# Patient Record
Sex: Female | Born: 1935 | Race: White | Hispanic: No | Marital: Single | Smoking: Current every day smoker
Health system: Southern US, Community
[De-identification: ages and names within clinical notes are randomized; demographics above are authoritative.]

## PROBLEM LIST (undated history)

## (undated) DIAGNOSIS — R911 Solitary pulmonary nodule: Secondary | ICD-10-CM

## (undated) DIAGNOSIS — E785 Hyperlipidemia, unspecified: Secondary | ICD-10-CM

## (undated) DIAGNOSIS — D751 Secondary polycythemia: Secondary | ICD-10-CM

## (undated) DIAGNOSIS — J449 Chronic obstructive pulmonary disease, unspecified: Secondary | ICD-10-CM

## (undated) DIAGNOSIS — E538 Deficiency of other specified B group vitamins: Secondary | ICD-10-CM

## (undated) DIAGNOSIS — C801 Malignant (primary) neoplasm, unspecified: Secondary | ICD-10-CM

## (undated) DIAGNOSIS — E559 Vitamin D deficiency, unspecified: Secondary | ICD-10-CM

## (undated) DIAGNOSIS — J189 Pneumonia, unspecified organism: Secondary | ICD-10-CM

## (undated) HISTORY — PX: EYE SURGERY: SHX253

## (undated) HISTORY — PX: COLON SURGERY: SHX602

## (undated) HISTORY — PX: CATARACT EXTRACTION: SUR2

## (undated) HISTORY — PX: TONSILLECTOMY: SUR1361

---

## 2004-12-31 ENCOUNTER — Ambulatory Visit: Payer: Self-pay | Admitting: Internal Medicine

## 2005-01-27 ENCOUNTER — Ambulatory Visit: Payer: Self-pay | Admitting: Gastroenterology

## 2006-12-20 ENCOUNTER — Ambulatory Visit: Payer: Self-pay | Admitting: Internal Medicine

## 2007-08-07 ENCOUNTER — Ambulatory Visit: Payer: Self-pay | Admitting: Ophthalmology

## 2008-01-09 ENCOUNTER — Ambulatory Visit: Payer: Self-pay | Admitting: Internal Medicine

## 2009-01-09 ENCOUNTER — Ambulatory Visit: Payer: Self-pay | Admitting: Internal Medicine

## 2010-01-13 ENCOUNTER — Ambulatory Visit: Payer: Self-pay | Admitting: Internal Medicine

## 2010-01-14 ENCOUNTER — Ambulatory Visit: Payer: Self-pay | Admitting: Internal Medicine

## 2010-01-15 ENCOUNTER — Ambulatory Visit: Payer: Self-pay | Admitting: Internal Medicine

## 2010-03-05 ENCOUNTER — Ambulatory Visit: Payer: Self-pay | Admitting: Gastroenterology

## 2010-06-23 ENCOUNTER — Ambulatory Visit: Payer: Self-pay | Admitting: Specialist

## 2010-06-30 ENCOUNTER — Ambulatory Visit: Payer: Self-pay | Admitting: Ophthalmology

## 2010-07-06 ENCOUNTER — Ambulatory Visit: Payer: Self-pay | Admitting: Ophthalmology

## 2010-11-30 ENCOUNTER — Ambulatory Visit: Payer: Self-pay | Admitting: Specialist

## 2011-01-19 ENCOUNTER — Ambulatory Visit: Payer: Self-pay | Admitting: Internal Medicine

## 2011-06-02 ENCOUNTER — Ambulatory Visit: Payer: Self-pay | Admitting: Specialist

## 2011-12-08 ENCOUNTER — Ambulatory Visit: Payer: Self-pay | Admitting: Specialist

## 2011-12-08 LAB — CREATININE, SERUM
Creatinine: 0.75 mg/dL (ref 0.60–1.30)
EGFR (African American): >60
EGFR (Non-African Amer.): >60

## 2012-01-13 ENCOUNTER — Ambulatory Visit: Payer: Self-pay | Admitting: Specialist

## 2012-02-04 ENCOUNTER — Ambulatory Visit: Payer: Self-pay | Admitting: Internal Medicine

## 2012-03-22 ENCOUNTER — Ambulatory Visit: Payer: Self-pay | Admitting: Specialist

## 2012-06-22 ENCOUNTER — Ambulatory Visit: Payer: Self-pay | Admitting: Specialist

## 2013-02-05 ENCOUNTER — Ambulatory Visit: Payer: Self-pay | Admitting: Internal Medicine

## 2013-06-25 ENCOUNTER — Ambulatory Visit: Payer: Self-pay | Admitting: Specialist

## 2014-01-15 ENCOUNTER — Ambulatory Visit: Payer: Self-pay | Admitting: Internal Medicine

## 2014-01-23 DIAGNOSIS — R918 Other nonspecific abnormal finding of lung field: Secondary | ICD-10-CM | POA: Insufficient documentation

## 2014-01-24 ENCOUNTER — Ambulatory Visit: Payer: Self-pay | Admitting: Internal Medicine

## 2014-02-06 ENCOUNTER — Ambulatory Visit: Payer: Self-pay | Admitting: Internal Medicine

## 2014-02-11 ENCOUNTER — Ambulatory Visit: Payer: Self-pay | Admitting: Internal Medicine

## 2014-02-11 LAB — PATHOLOGY REPORT

## 2014-02-12 DIAGNOSIS — Z720 Tobacco use: Secondary | ICD-10-CM | POA: Insufficient documentation

## 2014-02-12 DIAGNOSIS — F17200 Nicotine dependence, unspecified, uncomplicated: Secondary | ICD-10-CM | POA: Insufficient documentation

## 2014-02-13 ENCOUNTER — Ambulatory Visit: Payer: Self-pay | Admitting: Oncology

## 2014-02-13 LAB — CBC CANCER CENTER
Basophil #: 0.1 x10 3/mm (ref 0.0–0.1)
Basophil %: 1 %
Eosinophil #: 0.3 x10 3/mm (ref 0.0–0.7)
Eosinophil %: 3.1 %
HCT: 47 % (ref 35.0–47.0)
HGB: 15.7 g/dL (ref 12.0–16.0)
Lymphocyte #: 2 x10 3/mm (ref 1.0–3.6)
Lymphocyte %: 24.6 %
MCH: 30.9 pg (ref 26.0–34.0)
MCHC: 33.4 g/dL (ref 32.0–36.0)
MCV: 93 fL (ref 80–100)
MONO ABS: 0.5 x10 3/mm (ref 0.2–0.9)
Monocyte %: 6.3 %
Neutrophil #: 5.3 x10 3/mm (ref 1.4–6.5)
Neutrophil %: 65 %
Platelet: 241 x10 3/mm (ref 150–440)
RBC: 5.08 10*6/uL (ref 3.80–5.20)
RDW: 13.9 % (ref 11.5–14.5)
WBC: 8.2 x10 3/mm (ref 3.6–11.0)

## 2014-02-13 LAB — COMPREHENSIVE METABOLIC PANEL
ALBUMIN: 3.6 g/dL (ref 3.4–5.0)
ALT: 17 U/L (ref 12–78)
ANION GAP: 8 (ref 7–16)
Alkaline Phosphatase: 95 U/L
BILIRUBIN TOTAL: 0.3 mg/dL (ref 0.2–1.0)
BUN: 21 mg/dL — ABNORMAL HIGH (ref 7–18)
Calcium, Total: 9.9 mg/dL (ref 8.5–10.1)
Chloride: 103 mmol/L (ref 98–107)
Co2: 32 mmol/L (ref 21–32)
Creatinine: 0.85 mg/dL (ref 0.60–1.30)
EGFR (African American): >60
EGFR (Non-African Amer.): >60
Glucose: 97 mg/dL (ref 65–99)
Osmolality: 288 (ref 275–301)
Potassium: 3.8 mmol/L (ref 3.5–5.1)
SGOT(AST): 12 U/L — ABNORMAL LOW (ref 15–37)
SODIUM: 143 mmol/L (ref 136–145)
Total Protein: 7.9 g/dL (ref 6.4–8.2)

## 2014-02-19 ENCOUNTER — Ambulatory Visit: Payer: Self-pay | Admitting: Vascular Surgery

## 2014-02-22 LAB — BASIC METABOLIC PANEL
Anion Gap: 8 (ref 7–16)
BUN: 30 mg/dL — AB (ref 7–18)
CHLORIDE: 103 mmol/L (ref 98–107)
CO2: 26 mmol/L (ref 21–32)
CREATININE: 0.96 mg/dL (ref 0.60–1.30)
Calcium, Total: 8.7 mg/dL (ref 8.5–10.1)
EGFR (African American): >60
EGFR (Non-African Amer.): 57 — ABNORMAL LOW
GLUCOSE: 121 mg/dL — AB (ref 65–99)
OSMOLALITY: 281 (ref 275–301)
POTASSIUM: 4.4 mmol/L (ref 3.5–5.1)
SODIUM: 137 mmol/L (ref 136–145)

## 2014-02-22 LAB — MAGNESIUM: Magnesium: 1.7 mg/dL — ABNORMAL LOW

## 2014-02-27 ENCOUNTER — Ambulatory Visit: Payer: Self-pay | Admitting: Oncology

## 2014-02-27 LAB — CBC CANCER CENTER
Basophil #: 0 x10 3/mm (ref 0.0–0.1)
Basophil %: 0.5 %
Eosinophil #: 0.2 x10 3/mm (ref 0.0–0.7)
Eosinophil %: 2.6 %
HCT: 43.9 % (ref 35.0–47.0)
HGB: 14.8 g/dL (ref 12.0–16.0)
Lymphocyte #: 1.6 x10 3/mm (ref 1.0–3.6)
Lymphocyte %: 24.9 %
MCH: 31.2 pg (ref 26.0–34.0)
MCHC: 33.6 g/dL (ref 32.0–36.0)
MCV: 93 fL (ref 80–100)
Monocyte #: 0 x10 3/mm — ABNORMAL LOW (ref 0.2–0.9)
Monocyte %: 0.5 %
NEUTROS PCT: 71.5 %
Neutrophil #: 4.7 x10 3/mm (ref 1.4–6.5)
Platelet: 120 x10 3/mm — ABNORMAL LOW (ref 150–440)
RBC: 4.74 10*6/uL (ref 3.80–5.20)
RDW: 13.6 % (ref 11.5–14.5)
WBC: 6.6 x10 3/mm (ref 3.6–11.0)

## 2014-03-06 LAB — CBC CANCER CENTER
Basophil #: 0.1 x10 3/mm (ref 0.0–0.1)
Basophil %: 2.9 %
EOS ABS: 0 x10 3/mm (ref 0.0–0.7)
Eosinophil %: 1.4 %
HCT: 40.5 % (ref 35.0–47.0)
HGB: 13.7 g/dL (ref 12.0–16.0)
Lymphocyte #: 1.8 x10 3/mm (ref 1.0–3.6)
Lymphocyte %: 78.9 %
MCH: 31.4 pg (ref 26.0–34.0)
MCHC: 33.8 g/dL (ref 32.0–36.0)
MCV: 93 fL (ref 80–100)
MONOS PCT: 6.4 %
Monocyte #: 0.1 x10 3/mm — ABNORMAL LOW (ref 0.2–0.9)
NEUTROS ABS: 0.2 x10 3/mm — AB (ref 1.4–6.5)
NEUTROS PCT: 10.4 %
Platelet: 150 x10 3/mm (ref 150–440)
RBC: 4.37 10*6/uL (ref 3.80–5.20)
RDW: 13.2 % (ref 11.5–14.5)
WBC: 2.3 x10 3/mm — AB (ref 3.6–11.0)

## 2014-03-13 LAB — CBC CANCER CENTER
BASOS PCT: 1.3 %
Basophil #: 0.1 x10 3/mm (ref 0.0–0.1)
EOS ABS: 0.1 x10 3/mm (ref 0.0–0.7)
EOS PCT: 1 %
HCT: 42.4 % (ref 35.0–47.0)
HGB: 14.4 g/dL (ref 12.0–16.0)
LYMPHS PCT: 51.3 %
Lymphocyte #: 2.5 x10 3/mm (ref 1.0–3.6)
MCH: 31.3 pg (ref 26.0–34.0)
MCHC: 33.9 g/dL (ref 32.0–36.0)
MCV: 92 fL (ref 80–100)
Monocyte #: 0.7 x10 3/mm (ref 0.2–0.9)
Monocyte %: 13.6 %
NEUTROS ABS: 1.6 x10 3/mm (ref 1.4–6.5)
NEUTROS PCT: 32.8 %
Platelet: 436 x10 3/mm (ref 150–440)
RBC: 4.59 10*6/uL (ref 3.80–5.20)
RDW: 14.2 % (ref 11.5–14.5)
WBC: 4.9 x10 3/mm (ref 3.6–11.0)

## 2014-03-20 LAB — COMPREHENSIVE METABOLIC PANEL
ALBUMIN: 3.2 g/dL — AB (ref 3.4–5.0)
AST: 18 U/L (ref 15–37)
Alkaline Phosphatase: 87 U/L
Anion Gap: 7 (ref 7–16)
BILIRUBIN TOTAL: 0.3 mg/dL (ref 0.2–1.0)
BUN: 16 mg/dL (ref 7–18)
CALCIUM: 9.8 mg/dL (ref 8.5–10.1)
CREATININE: 0.76 mg/dL (ref 0.60–1.30)
Chloride: 102 mmol/L (ref 98–107)
Co2: 30 mmol/L (ref 21–32)
EGFR (African American): >60
EGFR (Non-African Amer.): >60
Glucose: 88 mg/dL (ref 65–99)
OSMOLALITY: 278 (ref 275–301)
POTASSIUM: 4 mmol/L (ref 3.5–5.1)
SGPT (ALT): 17 U/L
Sodium: 139 mmol/L (ref 136–145)
TOTAL PROTEIN: 7.1 g/dL (ref 6.4–8.2)

## 2014-03-20 LAB — CBC CANCER CENTER
BASOS PCT: 2.8 %
Basophil #: 0.2 x10 3/mm — ABNORMAL HIGH (ref 0.0–0.1)
Eosinophil #: 0 x10 3/mm (ref 0.0–0.7)
Eosinophil %: 0.6 %
HCT: 43.6 % (ref 35.0–47.0)
HGB: 14.5 g/dL (ref 12.0–16.0)
Lymphocyte #: 1.6 x10 3/mm (ref 1.0–3.6)
Lymphocyte %: 25.9 %
MCH: 30.8 pg (ref 26.0–34.0)
MCHC: 33.2 g/dL (ref 32.0–36.0)
MCV: 93 fL (ref 80–100)
MONO ABS: 0.7 x10 3/mm (ref 0.2–0.9)
Monocyte %: 11.6 %
Neutrophil #: 3.6 x10 3/mm (ref 1.4–6.5)
Neutrophil %: 59.1 %
PLATELETS: 331 x10 3/mm (ref 150–440)
RBC: 4.7 10*6/uL (ref 3.80–5.20)
RDW: 14.4 % (ref 11.5–14.5)
WBC: 6.1 x10 3/mm (ref 3.6–11.0)

## 2014-03-27 LAB — CBC CANCER CENTER
BASOS ABS: 0.1 x10 3/mm (ref 0.0–0.1)
Basophil %: 1.1 %
EOS ABS: 0.1 x10 3/mm (ref 0.0–0.7)
Eosinophil %: 2.1 %
HCT: 40.7 % (ref 35.0–47.0)
HGB: 13.8 g/dL (ref 12.0–16.0)
LYMPHS PCT: 12.2 %
Lymphocyte #: 0.7 x10 3/mm — ABNORMAL LOW (ref 1.0–3.6)
MCH: 31.7 pg (ref 26.0–34.0)
MCHC: 34 g/dL (ref 32.0–36.0)
MCV: 93 fL (ref 80–100)
MONO ABS: 0 x10 3/mm — AB (ref 0.2–0.9)
Monocyte %: 0.9 %
Neutrophil #: 4.8 x10 3/mm (ref 1.4–6.5)
Neutrophil %: 83.7 %
Platelet: 150 x10 3/mm (ref 150–440)
RBC: 4.36 10*6/uL (ref 3.80–5.20)
RDW: 14.1 % (ref 11.5–14.5)
WBC: 5.7 x10 3/mm (ref 3.6–11.0)

## 2014-03-30 ENCOUNTER — Ambulatory Visit: Payer: Self-pay | Admitting: Oncology

## 2014-04-10 LAB — COMPREHENSIVE METABOLIC PANEL
ANION GAP: 8 (ref 7–16)
Albumin: 3 g/dL — ABNORMAL LOW (ref 3.4–5.0)
Alkaline Phosphatase: 79 U/L
BILIRUBIN TOTAL: 0.3 mg/dL (ref 0.2–1.0)
BUN: 13 mg/dL (ref 7–18)
CREATININE: 0.96 mg/dL (ref 0.60–1.30)
Calcium, Total: 9 mg/dL (ref 8.5–10.1)
Chloride: 103 mmol/L (ref 98–107)
Co2: 29 mmol/L (ref 21–32)
EGFR (African American): >60
GFR CALC NON AF AMER: 57 — AB
GLUCOSE: 141 mg/dL — AB (ref 65–99)
Osmolality: 282 (ref 275–301)
Potassium: 3.3 mmol/L — ABNORMAL LOW (ref 3.5–5.1)
SGOT(AST): 13 U/L — ABNORMAL LOW (ref 15–37)
SGPT (ALT): 17 U/L
SODIUM: 140 mmol/L (ref 136–145)
Total Protein: 6.7 g/dL (ref 6.4–8.2)

## 2014-04-10 LAB — CBC CANCER CENTER
BASOS ABS: 0.1 x10 3/mm (ref 0.0–0.1)
Basophil %: 2.5 %
EOS ABS: 0.2 x10 3/mm (ref 0.0–0.7)
EOS PCT: 6.5 %
HCT: 38.9 % (ref 35.0–47.0)
HGB: 13 g/dL (ref 12.0–16.0)
Lymphocyte #: 0.6 x10 3/mm — ABNORMAL LOW (ref 1.0–3.6)
Lymphocyte %: 25.3 %
MCH: 31.4 pg (ref 26.0–34.0)
MCHC: 33.5 g/dL (ref 32.0–36.0)
MCV: 94 fL (ref 80–100)
Monocyte #: 0.7 x10 3/mm (ref 0.2–0.9)
Monocyte %: 28.5 %
Neutrophil #: 0.9 x10 3/mm — ABNORMAL LOW (ref 1.4–6.5)
Neutrophil %: 37.2 %
Platelet: 296 x10 3/mm (ref 150–440)
RBC: 4.15 10*6/uL (ref 3.80–5.20)
RDW: 14.2 % (ref 11.5–14.5)
WBC: 2.4 x10 3/mm — ABNORMAL LOW (ref 3.6–11.0)

## 2014-04-17 LAB — CREATININE, SERUM
Creatinine: 0.83 mg/dL (ref 0.60–1.30)
EGFR (African American): >60
EGFR (Non-African Amer.): >60

## 2014-04-17 LAB — CBC CANCER CENTER
Basophil #: 0.1 x10 3/mm (ref 0.0–0.1)
Basophil %: 2.7 %
EOS ABS: 0.1 x10 3/mm (ref 0.0–0.7)
Eosinophil %: 1.4 %
HCT: 39.6 % (ref 35.0–47.0)
HGB: 13.3 g/dL (ref 12.0–16.0)
LYMPHS ABS: 0.8 x10 3/mm — AB (ref 1.0–3.6)
Lymphocyte %: 15.9 %
MCH: 31.5 pg (ref 26.0–34.0)
MCHC: 33.5 g/dL (ref 32.0–36.0)
MCV: 94 fL (ref 80–100)
MONO ABS: 0.6 x10 3/mm (ref 0.2–0.9)
Monocyte %: 13.5 %
NEUTROS PCT: 66.5 %
Neutrophil #: 3.2 x10 3/mm (ref 1.4–6.5)
Platelet: 237 x10 3/mm (ref 150–440)
RBC: 4.21 10*6/uL (ref 3.80–5.20)
RDW: 15.8 % — AB (ref 11.5–14.5)
WBC: 4.8 x10 3/mm (ref 3.6–11.0)

## 2014-04-30 ENCOUNTER — Ambulatory Visit: Payer: Self-pay | Admitting: Oncology

## 2014-05-15 LAB — COMPREHENSIVE METABOLIC PANEL
Albumin: 3.1 g/dL — ABNORMAL LOW (ref 3.4–5.0)
Alkaline Phosphatase: 79 U/L
Anion Gap: 9 (ref 7–16)
BUN: 18 mg/dL (ref 7–18)
Bilirubin,Total: 0.2 mg/dL (ref 0.2–1.0)
CHLORIDE: 100 mmol/L (ref 98–107)
CO2: 26 mmol/L (ref 21–32)
CREATININE: 1.07 mg/dL (ref 0.60–1.30)
Calcium, Total: 8.8 mg/dL (ref 8.5–10.1)
EGFR (Non-African Amer.): 50 — ABNORMAL LOW
GFR CALC AF AMER: 58 — AB
GLUCOSE: 125 mg/dL — AB (ref 65–99)
OSMOLALITY: 273 (ref 275–301)
Potassium: 3.6 mmol/L (ref 3.5–5.1)
SGOT(AST): 18 U/L (ref 15–37)
SGPT (ALT): 16 U/L
Sodium: 135 mmol/L — ABNORMAL LOW (ref 136–145)
TOTAL PROTEIN: 6.8 g/dL (ref 6.4–8.2)

## 2014-05-15 LAB — CBC CANCER CENTER
Basophil #: 0.1 x10 3/mm (ref 0.0–0.1)
Basophil %: 2.3 %
Eosinophil #: 0 x10 3/mm (ref 0.0–0.7)
Eosinophil %: 0.7 %
HCT: 34.2 % — ABNORMAL LOW (ref 35.0–47.0)
HGB: 11.5 g/dL — ABNORMAL LOW (ref 12.0–16.0)
LYMPHS ABS: 0.5 x10 3/mm — AB (ref 1.0–3.6)
LYMPHS PCT: 11.8 %
MCH: 32.3 pg (ref 26.0–34.0)
MCHC: 33.7 g/dL (ref 32.0–36.0)
MCV: 96 fL (ref 80–100)
MONO ABS: 0.6 x10 3/mm (ref 0.2–0.9)
MONOS PCT: 12.4 %
Neutrophil #: 3.3 x10 3/mm (ref 1.4–6.5)
Neutrophil %: 72.8 %
Platelet: 233 x10 3/mm (ref 150–440)
RBC: 3.57 10*6/uL — ABNORMAL LOW (ref 3.80–5.20)
RDW: 16.5 % — AB (ref 11.5–14.5)
WBC: 4.5 x10 3/mm (ref 3.6–11.0)

## 2014-05-21 ENCOUNTER — Inpatient Hospital Stay: Payer: Self-pay | Admitting: Internal Medicine

## 2014-05-21 LAB — COMPREHENSIVE METABOLIC PANEL
Albumin: 2.9 g/dL — ABNORMAL LOW (ref 3.4–5.0)
Alkaline Phosphatase: 72 U/L
Anion Gap: 7 (ref 7–16)
BUN: 29 mg/dL — AB (ref 7–18)
Bilirubin,Total: 0.5 mg/dL (ref 0.2–1.0)
Calcium, Total: 7.9 mg/dL — ABNORMAL LOW (ref 8.5–10.1)
Chloride: 103 mmol/L (ref 98–107)
Co2: 27 mmol/L (ref 21–32)
Creatinine: 1.14 mg/dL (ref 0.60–1.30)
GFR CALC AF AMER: 53 — AB
GFR CALC NON AF AMER: 46 — AB
Glucose: 115 mg/dL — ABNORMAL HIGH (ref 65–99)
OSMOLALITY: 281 (ref 275–301)
POTASSIUM: 3.7 mmol/L (ref 3.5–5.1)
SGOT(AST): 26 U/L (ref 15–37)
SGPT (ALT): 23 U/L
Sodium: 137 mmol/L (ref 136–145)
TOTAL PROTEIN: 6.2 g/dL — AB (ref 6.4–8.2)

## 2014-05-21 LAB — URINALYSIS, COMPLETE
BILIRUBIN, UR: NEGATIVE
Glucose,UR: NEGATIVE mg/dL (ref 0–75)
KETONE: NEGATIVE
Nitrite: NEGATIVE
PH: 5 (ref 4.5–8.0)
Protein: 75
Specific Gravity: 1.005 (ref 1.003–1.030)
Squamous Epithelial: <1
WBC UR: 153 /HPF (ref 0–5)

## 2014-05-21 LAB — CBC
HCT: 30.8 % — ABNORMAL LOW (ref 35.0–47.0)
HGB: 10 g/dL — AB (ref 12.0–16.0)
MCH: 31.7 pg (ref 26.0–34.0)
MCHC: 32.5 g/dL (ref 32.0–36.0)
MCV: 97 fL (ref 80–100)
Platelet: 123 10*3/uL — ABNORMAL LOW (ref 150–440)
RBC: 3.16 10*6/uL — AB (ref 3.80–5.20)
RDW: 16.7 % — ABNORMAL HIGH (ref 11.5–14.5)
WBC: 5 10*3/uL (ref 3.6–11.0)

## 2014-05-21 LAB — LIPASE, BLOOD: Lipase: 59 U/L — ABNORMAL LOW (ref 73–393)

## 2014-05-21 LAB — TROPONIN I

## 2014-05-22 LAB — CBC WITH DIFFERENTIAL/PLATELET
Basophil #: 0 10*3/uL (ref 0.0–0.1)
Basophil %: 0.2 %
EOS PCT: 0.1 %
Eosinophil #: 0 10*3/uL (ref 0.0–0.7)
HCT: 28.1 % — ABNORMAL LOW (ref 35.0–47.0)
HGB: 9.4 g/dL — AB (ref 12.0–16.0)
LYMPHS PCT: 2.4 %
Lymphocyte #: 0.1 10*3/uL — ABNORMAL LOW (ref 1.0–3.6)
MCH: 32.4 pg (ref 26.0–34.0)
MCHC: 33.7 g/dL (ref 32.0–36.0)
MCV: 96 fL (ref 80–100)
MONOS PCT: 0.3 %
Monocyte #: 0 x10 3/mm — ABNORMAL LOW (ref 0.2–0.9)
NEUTROS ABS: 3.3 10*3/uL (ref 1.4–6.5)
Neutrophil %: 97 %
PLATELETS: 94 10*3/uL — AB (ref 150–440)
RBC: 2.92 10*6/uL — ABNORMAL LOW (ref 3.80–5.20)
RDW: 16.7 % — ABNORMAL HIGH (ref 11.5–14.5)
WBC: 3.4 10*3/uL — ABNORMAL LOW (ref 3.6–11.0)

## 2014-05-22 LAB — BASIC METABOLIC PANEL
ANION GAP: 5 — AB (ref 7–16)
BUN: 27 mg/dL — ABNORMAL HIGH (ref 7–18)
CHLORIDE: 108 mmol/L — AB (ref 98–107)
Calcium, Total: 7.2 mg/dL — ABNORMAL LOW (ref 8.5–10.1)
Co2: 25 mmol/L (ref 21–32)
Creatinine: 1.11 mg/dL (ref 0.60–1.30)
EGFR (African American): 55 — ABNORMAL LOW
GFR CALC NON AF AMER: 48 — AB
Glucose: 137 mg/dL — ABNORMAL HIGH (ref 65–99)
Osmolality: 283 (ref 275–301)
Potassium: 4 mmol/L (ref 3.5–5.1)
Sodium: 138 mmol/L (ref 136–145)

## 2014-05-23 LAB — CBC WITH DIFFERENTIAL/PLATELET
Basophil #: 0 10*3/uL (ref 0.0–0.1)
Basophil %: 0.1 %
Eosinophil #: 0 10*3/uL (ref 0.0–0.7)
Eosinophil %: 0 %
HCT: 25.6 % — ABNORMAL LOW (ref 35.0–47.0)
HGB: 8.6 g/dL — AB (ref 12.0–16.0)
LYMPHS ABS: 0.1 10*3/uL — AB (ref 1.0–3.6)
Lymphocyte %: 1.7 %
MCH: 32.6 pg (ref 26.0–34.0)
MCHC: 33.8 g/dL (ref 32.0–36.0)
MCV: 96 fL (ref 80–100)
Monocyte #: 0 x10 3/mm — ABNORMAL LOW (ref 0.2–0.9)
Monocyte %: 0.1 %
NEUTROS PCT: 98.1 %
Neutrophil #: 5.3 10*3/uL (ref 1.4–6.5)
PLATELETS: 74 10*3/uL — AB (ref 150–440)
RBC: 2.66 10*6/uL — ABNORMAL LOW (ref 3.80–5.20)
RDW: 16.8 % — AB (ref 11.5–14.5)
WBC: 5.4 10*3/uL (ref 3.6–11.0)

## 2014-05-23 LAB — URINE CULTURE

## 2014-05-23 LAB — COMPREHENSIVE METABOLIC PANEL
ALBUMIN: 2.6 g/dL — AB (ref 3.4–5.0)
ALK PHOS: 58 U/L
Anion Gap: 7 (ref 7–16)
BUN: 26 mg/dL — AB (ref 7–18)
Bilirubin,Total: 0.2 mg/dL (ref 0.2–1.0)
CREATININE: 1.03 mg/dL (ref 0.60–1.30)
Calcium, Total: 7.1 mg/dL — ABNORMAL LOW (ref 8.5–10.1)
Chloride: 109 mmol/L — ABNORMAL HIGH (ref 98–107)
Co2: 25 mmol/L (ref 21–32)
EGFR (African American): >60
GFR CALC NON AF AMER: 55 — AB
Glucose: 139 mg/dL — ABNORMAL HIGH (ref 65–99)
OSMOLALITY: 288 (ref 275–301)
Potassium: 3.7 mmol/L (ref 3.5–5.1)
SGOT(AST): 24 U/L (ref 15–37)
SGPT (ALT): 19 U/L
Sodium: 141 mmol/L (ref 136–145)
TOTAL PROTEIN: 5.6 g/dL — AB (ref 6.4–8.2)

## 2014-05-24 ENCOUNTER — Inpatient Hospital Stay: Payer: Self-pay | Admitting: Internal Medicine

## 2014-05-24 LAB — CBC WITH DIFFERENTIAL/PLATELET
BASOS PCT: 0 %
Basophil #: 0 10*3/uL (ref 0.0–0.1)
EOS ABS: 0 10*3/uL (ref 0.0–0.7)
EOS PCT: 0 %
HCT: 25.4 % — ABNORMAL LOW (ref 35.0–47.0)
HGB: 8.5 g/dL — ABNORMAL LOW (ref 12.0–16.0)
LYMPHS ABS: 0.1 10*3/uL — AB (ref 1.0–3.6)
LYMPHS PCT: 3.4 %
MCH: 32.3 pg (ref 26.0–34.0)
MCHC: 33.6 g/dL (ref 32.0–36.0)
MCV: 96 fL (ref 80–100)
Monocyte #: 0 x10 3/mm — ABNORMAL LOW (ref 0.2–0.9)
Monocyte %: 0.8 %
NEUTROS PCT: 95.8 %
Neutrophil #: 3.6 10*3/uL (ref 1.4–6.5)
Platelet: 54 10*3/uL — ABNORMAL LOW (ref 150–440)
RBC: 2.65 10*6/uL — ABNORMAL LOW (ref 3.80–5.20)
RDW: 16.7 % — AB (ref 11.5–14.5)
WBC: 3.8 10*3/uL (ref 3.6–11.0)

## 2014-05-24 LAB — BASIC METABOLIC PANEL
Anion Gap: 8 (ref 7–16)
BUN: 24 mg/dL — AB (ref 7–18)
CREATININE: 0.92 mg/dL (ref 0.60–1.30)
Calcium, Total: 6.8 mg/dL — CL (ref 8.5–10.1)
Chloride: 107 mmol/L (ref 98–107)
Co2: 27 mmol/L (ref 21–32)
EGFR (Non-African Amer.): >60
Glucose: 107 mg/dL — ABNORMAL HIGH (ref 65–99)
OSMOLALITY: 288 (ref 275–301)
Potassium: 3.1 mmol/L — ABNORMAL LOW (ref 3.5–5.1)
Sodium: 142 mmol/L (ref 136–145)

## 2014-05-24 LAB — CBC
HCT: 27.1 % — ABNORMAL LOW (ref 35.0–47.0)
HGB: 9.1 g/dL — AB (ref 12.0–16.0)
MCH: 32.3 pg (ref 26.0–34.0)
MCHC: 33.5 g/dL (ref 32.0–36.0)
MCV: 96 fL (ref 80–100)
Platelet: 51 10*3/uL — ABNORMAL LOW (ref 150–440)
RBC: 2.82 10*6/uL — AB (ref 3.80–5.20)
RDW: 16.8 % — ABNORMAL HIGH (ref 11.5–14.5)
WBC: 2.2 10*3/uL — AB (ref 3.6–11.0)

## 2014-05-24 LAB — COMPREHENSIVE METABOLIC PANEL
ALBUMIN: 3.1 g/dL — AB (ref 3.4–5.0)
ANION GAP: 8 (ref 7–16)
Alkaline Phosphatase: 60 U/L
BUN: 28 mg/dL — ABNORMAL HIGH (ref 7–18)
Bilirubin,Total: 0.4 mg/dL (ref 0.2–1.0)
CREATININE: 1.22 mg/dL (ref 0.60–1.30)
Calcium, Total: 6.7 mg/dL — CL (ref 8.5–10.1)
Chloride: 107 mmol/L (ref 98–107)
Co2: 25 mmol/L (ref 21–32)
EGFR (African American): 55 — ABNORMAL LOW
EGFR (Non-African Amer.): 45 — ABNORMAL LOW
Glucose: 142 mg/dL — ABNORMAL HIGH (ref 65–99)
OSMOLALITY: 287 (ref 275–301)
Potassium: 3.5 mmol/L (ref 3.5–5.1)
SGOT(AST): 24 U/L (ref 15–37)
SGPT (ALT): 30 U/L
SODIUM: 140 mmol/L (ref 136–145)
Total Protein: 6.3 g/dL — ABNORMAL LOW (ref 6.4–8.2)

## 2014-05-24 LAB — MAGNESIUM: Magnesium: <0.3 mg/dL

## 2014-05-24 LAB — PHOSPHORUS: PHOSPHORUS: 2.6 mg/dL (ref 2.5–4.9)

## 2014-05-25 LAB — CBC WITH DIFFERENTIAL/PLATELET
Basophil #: 0 10*3/uL (ref 0.0–0.1)
Basophil %: 0.2 %
EOS ABS: 0 10*3/uL (ref 0.0–0.7)
Eosinophil %: 0.2 %
HCT: 28.5 % — ABNORMAL LOW (ref 35.0–47.0)
HGB: 9.4 g/dL — ABNORMAL LOW (ref 12.0–16.0)
Lymphocyte #: 0.3 10*3/uL — ABNORMAL LOW (ref 1.0–3.6)
Lymphocyte %: 13.2 %
MCH: 32 pg (ref 26.0–34.0)
MCHC: 32.9 g/dL (ref 32.0–36.0)
MCV: 97 fL (ref 80–100)
MONO ABS: 0 x10 3/mm — AB (ref 0.2–0.9)
MONOS PCT: 2.1 %
NEUTROS ABS: 1.9 10*3/uL (ref 1.4–6.5)
Neutrophil %: 84.3 %
Platelet: 49 10*3/uL — ABNORMAL LOW (ref 150–440)
RBC: 2.93 10*6/uL — ABNORMAL LOW (ref 3.80–5.20)
RDW: 17.1 % — ABNORMAL HIGH (ref 11.5–14.5)
WBC: 2.2 10*3/uL — AB (ref 3.6–11.0)

## 2014-05-25 LAB — BASIC METABOLIC PANEL
ANION GAP: 7 (ref 7–16)
BUN: 22 mg/dL — ABNORMAL HIGH (ref 7–18)
Calcium, Total: 7.9 mg/dL — ABNORMAL LOW (ref 8.5–10.1)
Chloride: 105 mmol/L (ref 98–107)
Co2: 29 mmol/L (ref 21–32)
Creatinine: 1.03 mg/dL (ref 0.60–1.30)
EGFR (Non-African Amer.): 55 — ABNORMAL LOW
Glucose: 80 mg/dL (ref 65–99)
Osmolality: 284 (ref 275–301)
Potassium: 3.4 mmol/L — ABNORMAL LOW (ref 3.5–5.1)
Sodium: 141 mmol/L (ref 136–145)

## 2014-05-25 LAB — MAGNESIUM: MAGNESIUM: 1.6 mg/dL — AB

## 2014-05-26 LAB — BASIC METABOLIC PANEL
Anion Gap: 5 — ABNORMAL LOW (ref 7–16)
BUN: 31 mg/dL — ABNORMAL HIGH (ref 7–18)
CALCIUM: 8 mg/dL — AB (ref 8.5–10.1)
CHLORIDE: 104 mmol/L (ref 98–107)
CO2: 31 mmol/L (ref 21–32)
CREATININE: 0.89 mg/dL (ref 0.60–1.30)
EGFR (African American): >60
EGFR (Non-African Amer.): >60
Glucose: 65 mg/dL (ref 65–99)
OSMOLALITY: 284 (ref 275–301)
Potassium: 3.1 mmol/L — ABNORMAL LOW (ref 3.5–5.1)
Sodium: 140 mmol/L (ref 136–145)

## 2014-05-26 LAB — CBC WITH DIFFERENTIAL/PLATELET
BASOS ABS: 0 10*3/uL (ref 0.0–0.1)
BASOS PCT: 0.2 %
Eosinophil #: 0 10*3/uL (ref 0.0–0.7)
Eosinophil %: 0.6 %
HCT: 28 % — AB (ref 35.0–47.0)
HGB: 9.3 g/dL — AB (ref 12.0–16.0)
LYMPHS ABS: 0.3 10*3/uL — AB (ref 1.0–3.6)
Lymphocyte %: 20.3 %
MCH: 32.1 pg (ref 26.0–34.0)
MCHC: 33.2 g/dL (ref 32.0–36.0)
MCV: 97 fL (ref 80–100)
Monocyte #: 0.1 x10 3/mm — ABNORMAL LOW (ref 0.2–0.9)
Monocyte %: 4.5 %
Neutrophil #: 1 10*3/uL — ABNORMAL LOW (ref 1.4–6.5)
Neutrophil %: 74.4 %
PLATELETS: 36 10*3/uL — AB (ref 150–440)
RBC: 2.9 10*6/uL — AB (ref 3.80–5.20)
RDW: 16.6 % — ABNORMAL HIGH (ref 11.5–14.5)
WBC: 1.3 10*3/uL — CL (ref 3.6–11.0)

## 2014-05-26 LAB — CULTURE, BLOOD (SINGLE)

## 2014-05-26 LAB — MAGNESIUM: MAGNESIUM: 0.9 mg/dL — AB

## 2014-05-27 LAB — CBC WITH DIFFERENTIAL/PLATELET
BASOS ABS: 0 10*3/uL (ref 0.0–0.1)
BASOS PCT: 0.1 %
EOS ABS: 0 10*3/uL (ref 0.0–0.7)
Eosinophil %: 0.7 %
HCT: 29.2 % — ABNORMAL LOW (ref 35.0–47.0)
HGB: 9.8 g/dL — ABNORMAL LOW (ref 12.0–16.0)
LYMPHS PCT: 25.7 %
Lymphocyte #: 0.3 10*3/uL — ABNORMAL LOW (ref 1.0–3.6)
MCH: 32.5 pg (ref 26.0–34.0)
MCHC: 33.5 g/dL (ref 32.0–36.0)
MCV: 97 fL (ref 80–100)
Monocyte #: 0.1 x10 3/mm — ABNORMAL LOW (ref 0.2–0.9)
Monocyte %: 7.4 %
Neutrophil #: 0.7 10*3/uL — ABNORMAL LOW (ref 1.4–6.5)
Neutrophil %: 66.1 %
PLATELETS: 32 10*3/uL — AB (ref 150–440)
RBC: 3.01 10*6/uL — AB (ref 3.80–5.20)
RDW: 16.9 % — ABNORMAL HIGH (ref 11.5–14.5)
WBC: 1.1 10*3/uL — AB (ref 3.6–11.0)

## 2014-05-27 LAB — BASIC METABOLIC PANEL
Anion Gap: 6 — ABNORMAL LOW (ref 7–16)
BUN: 43 mg/dL — ABNORMAL HIGH (ref 7–18)
CHLORIDE: 105 mmol/L (ref 98–107)
CO2: 29 mmol/L (ref 21–32)
Calcium, Total: 8.5 mg/dL (ref 8.5–10.1)
Creatinine: 0.98 mg/dL (ref 0.60–1.30)
GFR CALC NON AF AMER: 58 — AB
GLUCOSE: 83 mg/dL (ref 65–99)
Osmolality: 289 (ref 275–301)
POTASSIUM: 4.6 mmol/L (ref 3.5–5.1)
Sodium: 140 mmol/L (ref 136–145)

## 2014-05-27 LAB — MAGNESIUM: Magnesium: 1.6 mg/dL — ABNORMAL LOW

## 2014-05-27 LAB — OCCULT BLOOD X 1 CARD TO LAB, STOOL: Occult Blood, Feces: NEGATIVE

## 2014-05-28 LAB — CBC WITH DIFFERENTIAL/PLATELET
BASOS PCT: 0.3 %
Basophil #: 0 10*3/uL (ref 0.0–0.1)
EOS PCT: 1 %
Eosinophil #: 0 10*3/uL (ref 0.0–0.7)
HCT: 29 % — ABNORMAL LOW (ref 35.0–47.0)
HGB: 9.7 g/dL — ABNORMAL LOW (ref 12.0–16.0)
LYMPHS PCT: 27.6 %
Lymphocyte #: 0.2 10*3/uL — ABNORMAL LOW (ref 1.0–3.6)
MCH: 32.5 pg (ref 26.0–34.0)
MCHC: 33.4 g/dL (ref 32.0–36.0)
MCV: 97 fL (ref 80–100)
MONO ABS: 0.1 x10 3/mm — AB (ref 0.2–0.9)
MONOS PCT: 19.7 %
Neutrophil #: 0.3 10*3/uL — ABNORMAL LOW (ref 1.4–6.5)
Neutrophil %: 51.4 %
RBC: 2.98 10*6/uL — ABNORMAL LOW (ref 3.80–5.20)
RDW: 16.7 % — AB (ref 11.5–14.5)
WBC: 0.7 10*3/uL — CL (ref 3.6–11.0)

## 2014-05-28 LAB — BASIC METABOLIC PANEL
Anion Gap: 4 — ABNORMAL LOW (ref 7–16)
BUN: 47 mg/dL — ABNORMAL HIGH (ref 7–18)
CALCIUM: 9 mg/dL (ref 8.5–10.1)
CO2: 27 mmol/L (ref 21–32)
CREATININE: 1.08 mg/dL (ref 0.60–1.30)
Chloride: 105 mmol/L (ref 98–107)
EGFR (African American): >60
EGFR (Non-African Amer.): 52 — ABNORMAL LOW
Glucose: 87 mg/dL (ref 65–99)
Osmolality: 284 (ref 275–301)
Potassium: 5.3 mmol/L — ABNORMAL HIGH (ref 3.5–5.1)
Sodium: 136 mmol/L (ref 136–145)

## 2014-05-28 LAB — MAGNESIUM: MAGNESIUM: 1.7 mg/dL — AB

## 2014-05-29 LAB — BASIC METABOLIC PANEL
Anion Gap: 6 — ABNORMAL LOW (ref 7–16)
BUN: 41 mg/dL — ABNORMAL HIGH (ref 7–18)
Calcium, Total: 8.9 mg/dL (ref 8.5–10.1)
Chloride: 103 mmol/L (ref 98–107)
Co2: 27 mmol/L (ref 21–32)
Creatinine: 1.08 mg/dL (ref 0.60–1.30)
EGFR (Non-African Amer.): 52 — ABNORMAL LOW
Glucose: 107 mg/dL — ABNORMAL HIGH (ref 65–99)
OSMOLALITY: 283 (ref 275–301)
Potassium: 4.8 mmol/L (ref 3.5–5.1)
SODIUM: 136 mmol/L (ref 136–145)

## 2014-05-29 LAB — CBC WITH DIFFERENTIAL/PLATELET
BASOS ABS: 0 10*3/uL (ref 0.0–0.1)
BASOS PCT: 0.2 %
EOS ABS: 0 10*3/uL (ref 0.0–0.7)
Eosinophil %: 1.9 %
HCT: 27.5 % — AB (ref 35.0–47.0)
HGB: 9.4 g/dL — AB (ref 12.0–16.0)
LYMPHS ABS: 0.3 10*3/uL — AB (ref 1.0–3.6)
Lymphocyte %: 38.3 %
MCH: 33.2 pg (ref 26.0–34.0)
MCHC: 34.3 g/dL (ref 32.0–36.0)
MCV: 97 fL (ref 80–100)
Monocyte #: 0.3 x10 3/mm (ref 0.2–0.9)
Monocyte %: 34.3 %
NEUTROS ABS: 0.2 10*3/uL — AB (ref 1.4–6.5)
NEUTROS PCT: 25.3 %
Platelet: 41 10*3/uL — ABNORMAL LOW (ref 150–440)
RBC: 2.84 10*6/uL — AB (ref 3.80–5.20)
RDW: 16.5 % — ABNORMAL HIGH (ref 11.5–14.5)
WBC: 0.8 10*3/uL — AB (ref 3.6–11.0)

## 2014-05-29 LAB — MAGNESIUM: Magnesium: 1.9 mg/dL

## 2014-05-30 ENCOUNTER — Ambulatory Visit: Payer: Self-pay | Admitting: Oncology

## 2014-06-05 LAB — MAGNESIUM: MAGNESIUM: 0.7 mg/dL — AB

## 2014-06-05 LAB — COMPREHENSIVE METABOLIC PANEL
ALK PHOS: 81 U/L
AST: 23 U/L (ref 15–37)
Albumin: 3.3 g/dL — ABNORMAL LOW (ref 3.4–5.0)
Anion Gap: 9 (ref 7–16)
BUN: 41 mg/dL — ABNORMAL HIGH (ref 7–18)
Bilirubin,Total: 0.2 mg/dL (ref 0.2–1.0)
CALCIUM: 10 mg/dL (ref 8.5–10.1)
CO2: 27 mmol/L (ref 21–32)
Chloride: 100 mmol/L (ref 98–107)
Creatinine: 1.17 mg/dL (ref 0.60–1.30)
EGFR (African American): 58 — ABNORMAL LOW
EGFR (Non-African Amer.): 48 — ABNORMAL LOW
Glucose: 152 mg/dL — ABNORMAL HIGH (ref 65–99)
Osmolality: 285 (ref 275–301)
POTASSIUM: 4.3 mmol/L (ref 3.5–5.1)
SGPT (ALT): 33 U/L
SODIUM: 136 mmol/L (ref 136–145)
TOTAL PROTEIN: 6.4 g/dL (ref 6.4–8.2)

## 2014-06-05 LAB — CBC CANCER CENTER
Basophil #: 0 x10 3/mm (ref 0.0–0.1)
Basophil %: 0.3 %
EOS PCT: 0.1 %
Eosinophil #: 0 x10 3/mm (ref 0.0–0.7)
HCT: 30.7 % — AB (ref 35.0–47.0)
HGB: 10.3 g/dL — ABNORMAL LOW (ref 12.0–16.0)
LYMPHS ABS: 0.4 x10 3/mm — AB (ref 1.0–3.6)
Lymphocyte %: 4.4 %
MCH: 33.1 pg (ref 26.0–34.0)
MCHC: 33.6 g/dL (ref 32.0–36.0)
MCV: 99 fL (ref 80–100)
MONO ABS: 0.6 x10 3/mm (ref 0.2–0.9)
Monocyte %: 6.7 %
Neutrophil #: 8.2 x10 3/mm — ABNORMAL HIGH (ref 1.4–6.5)
Neutrophil %: 88.5 %
Platelet: 206 x10 3/mm (ref 150–440)
RBC: 3.11 10*6/uL — ABNORMAL LOW (ref 3.80–5.20)
RDW: 17.9 % — ABNORMAL HIGH (ref 11.5–14.5)
WBC: 9.3 x10 3/mm (ref 3.6–11.0)

## 2014-06-06 LAB — MAGNESIUM: MAGNESIUM: 2.2 mg/dL

## 2014-06-12 LAB — CBC CANCER CENTER
BASOS PCT: 0.4 %
Basophil #: 0 x10 3/mm (ref 0.0–0.1)
Eosinophil #: 0 x10 3/mm (ref 0.0–0.7)
Eosinophil %: 0.1 %
HCT: 30.2 % — ABNORMAL LOW (ref 35.0–47.0)
HGB: 10 g/dL — AB (ref 12.0–16.0)
LYMPHS PCT: 6.8 %
Lymphocyte #: 0.6 x10 3/mm — ABNORMAL LOW (ref 1.0–3.6)
MCH: 33.4 pg (ref 26.0–34.0)
MCHC: 33.1 g/dL (ref 32.0–36.0)
MCV: 101 fL — ABNORMAL HIGH (ref 80–100)
Monocyte #: 1.2 x10 3/mm — ABNORMAL HIGH (ref 0.2–0.9)
Monocyte %: 12.8 %
Neutrophil #: 7.6 x10 3/mm — ABNORMAL HIGH (ref 1.4–6.5)
Neutrophil %: 79.9 %
PLATELETS: 231 x10 3/mm (ref 150–440)
RBC: 2.99 10*6/uL — ABNORMAL LOW (ref 3.80–5.20)
RDW: 19.5 % — ABNORMAL HIGH (ref 11.5–14.5)
WBC: 9.5 x10 3/mm (ref 3.6–11.0)

## 2014-06-12 LAB — COMPREHENSIVE METABOLIC PANEL
ALBUMIN: 3.2 g/dL — AB (ref 3.4–5.0)
Alkaline Phosphatase: 80 U/L
Anion Gap: 7 (ref 7–16)
BUN: 33 mg/dL — ABNORMAL HIGH (ref 7–18)
Bilirubin,Total: 0.2 mg/dL (ref 0.2–1.0)
CALCIUM: 9.5 mg/dL (ref 8.5–10.1)
CO2: 29 mmol/L (ref 21–32)
Chloride: 104 mmol/L (ref 98–107)
Creatinine: 1.08 mg/dL (ref 0.60–1.30)
EGFR (African American): >60
GFR CALC NON AF AMER: 52 — AB
Glucose: 95 mg/dL (ref 65–99)
Osmolality: 286 (ref 275–301)
Potassium: 4.6 mmol/L (ref 3.5–5.1)
SGOT(AST): 14 U/L — ABNORMAL LOW (ref 15–37)
SGPT (ALT): 31 U/L
Sodium: 140 mmol/L (ref 136–145)
TOTAL PROTEIN: 6.2 g/dL — AB (ref 6.4–8.2)

## 2014-06-12 LAB — MAGNESIUM: MAGNESIUM: 1.1 mg/dL — AB

## 2014-06-24 LAB — MAGNESIUM: MAGNESIUM: 1.6 mg/dL — AB

## 2014-06-30 ENCOUNTER — Ambulatory Visit: Payer: Self-pay | Admitting: Oncology

## 2014-07-08 LAB — MAGNESIUM: Magnesium: 1.4 mg/dL — ABNORMAL LOW

## 2014-07-17 LAB — CBC CANCER CENTER
BASOS PCT: 0.3 %
Basophil #: 0 x10 3/mm (ref 0.0–0.1)
Eosinophil #: 0 x10 3/mm (ref 0.0–0.7)
Eosinophil %: 0 %
HCT: 39.5 % (ref 35.0–47.0)
HGB: 12.8 g/dL (ref 12.0–16.0)
LYMPHS PCT: 1.9 %
Lymphocyte #: 0.2 x10 3/mm — ABNORMAL LOW (ref 1.0–3.6)
MCH: 33 pg (ref 26.0–34.0)
MCHC: 32.5 g/dL (ref 32.0–36.0)
MCV: 102 fL — ABNORMAL HIGH (ref 80–100)
MONO ABS: 0.1 x10 3/mm — AB (ref 0.2–0.9)
Monocyte %: 1.5 %
Neutrophil #: 9.3 x10 3/mm — ABNORMAL HIGH (ref 1.4–6.5)
Neutrophil %: 96.3 %
PLATELETS: 289 x10 3/mm (ref 150–440)
RBC: 3.88 10*6/uL (ref 3.80–5.20)
RDW: 18.2 % — ABNORMAL HIGH (ref 11.5–14.5)
WBC: 9.6 x10 3/mm (ref 3.6–11.0)

## 2014-07-30 ENCOUNTER — Ambulatory Visit: Payer: Self-pay | Admitting: Oncology

## 2014-07-30 LAB — COMPREHENSIVE METABOLIC PANEL
ALK PHOS: 76 U/L
AST: 21 U/L (ref 15–37)
Albumin: 3.3 g/dL — ABNORMAL LOW (ref 3.4–5.0)
Anion Gap: 10 (ref 7–16)
BUN: 48 mg/dL — ABNORMAL HIGH (ref 7–18)
Bilirubin,Total: 0.5 mg/dL (ref 0.2–1.0)
CHLORIDE: 101 mmol/L (ref 98–107)
CREATININE: 1.26 mg/dL (ref 0.60–1.30)
Calcium, Total: 9.1 mg/dL (ref 8.5–10.1)
Co2: 26 mmol/L (ref 21–32)
EGFR (African American): 53 — ABNORMAL LOW
GFR CALC NON AF AMER: 44 — AB
Glucose: 114 mg/dL — ABNORMAL HIGH (ref 65–99)
Osmolality: 287 (ref 275–301)
POTASSIUM: 4.4 mmol/L (ref 3.5–5.1)
SGPT (ALT): 38 U/L
SODIUM: 137 mmol/L (ref 136–145)
Total Protein: 6.7 g/dL (ref 6.4–8.2)

## 2014-07-30 LAB — CBC CANCER CENTER
BASOS ABS: 0 x10 3/mm (ref 0.0–0.1)
Basophil %: 0.4 %
EOS ABS: 0 x10 3/mm (ref 0.0–0.7)
Eosinophil %: 0.1 %
HCT: 42.1 % (ref 35.0–47.0)
HGB: 13.8 g/dL (ref 12.0–16.0)
Lymphocyte #: 0.3 x10 3/mm — ABNORMAL LOW (ref 1.0–3.6)
Lymphocyte %: 2.4 %
MCH: 33.2 pg (ref 26.0–34.0)
MCHC: 32.9 g/dL (ref 32.0–36.0)
MCV: 101 fL — ABNORMAL HIGH (ref 80–100)
Monocyte #: 0.6 x10 3/mm (ref 0.2–0.9)
Monocyte %: 4.7 %
NEUTROS ABS: 11.7 x10 3/mm — AB (ref 1.4–6.5)
Neutrophil %: 92.4 %
Platelet: 165 x10 3/mm (ref 150–440)
RBC: 4.16 10*6/uL (ref 3.80–5.20)
RDW: 16.9 % — AB (ref 11.5–14.5)
WBC: 12.6 x10 3/mm — ABNORMAL HIGH (ref 3.6–11.0)

## 2014-07-30 LAB — MAGNESIUM: MAGNESIUM: 1.7 mg/dL — AB

## 2014-08-05 LAB — MAGNESIUM: MAGNESIUM: 1.6 mg/dL — AB

## 2014-08-07 LAB — COMPREHENSIVE METABOLIC PANEL
ALBUMIN: 2.8 g/dL — AB (ref 3.4–5.0)
ALT: 31 U/L
Alkaline Phosphatase: 97 U/L
Anion Gap: 7 (ref 7–16)
BILIRUBIN TOTAL: 0.3 mg/dL (ref 0.2–1.0)
BUN: 32 mg/dL — AB (ref 7–18)
CALCIUM: 8.7 mg/dL (ref 8.5–10.1)
CHLORIDE: 107 mmol/L (ref 98–107)
Co2: 27 mmol/L (ref 21–32)
Creatinine: 1.17 mg/dL (ref 0.60–1.30)
EGFR (African American): 57 — ABNORMAL LOW
EGFR (Non-African Amer.): 47 — ABNORMAL LOW
GLUCOSE: 132 mg/dL — AB (ref 65–99)
Osmolality: 290 (ref 275–301)
POTASSIUM: 3.4 mmol/L — AB (ref 3.5–5.1)
SGOT(AST): 25 U/L (ref 15–37)
SODIUM: 141 mmol/L (ref 136–145)
TOTAL PROTEIN: 6.8 g/dL (ref 6.4–8.2)

## 2014-08-07 LAB — CBC CANCER CENTER
Basophil #: 0 x10 3/mm (ref 0.0–0.1)
Basophil %: 0.3 %
Eosinophil #: 0.1 x10 3/mm (ref 0.0–0.7)
Eosinophil %: 0.8 %
HCT: 41 % (ref 35.0–47.0)
HGB: 13.6 g/dL (ref 12.0–16.0)
Lymphocyte #: 0.3 x10 3/mm — ABNORMAL LOW (ref 1.0–3.6)
Lymphocyte %: 3.7 %
MCH: 33.4 pg (ref 26.0–34.0)
MCHC: 33.2 g/dL (ref 32.0–36.0)
MCV: 101 fL — ABNORMAL HIGH (ref 80–100)
Monocyte #: 0.4 x10 3/mm (ref 0.2–0.9)
Monocyte %: 5.4 %
Neutrophil #: 6.1 x10 3/mm (ref 1.4–6.5)
Neutrophil %: 89.8 %
Platelet: 120 x10 3/mm — ABNORMAL LOW (ref 150–440)
RBC: 4.07 10*6/uL (ref 3.80–5.20)
RDW: 16.6 % — ABNORMAL HIGH (ref 11.5–14.5)
WBC: 6.8 x10 3/mm (ref 3.6–11.0)

## 2014-08-07 LAB — MAGNESIUM: Magnesium: 1.8 mg/dL

## 2014-08-13 ENCOUNTER — Encounter: Payer: Self-pay | Admitting: Oncology

## 2014-08-21 LAB — COMPREHENSIVE METABOLIC PANEL
Albumin: 2.4 g/dL — ABNORMAL LOW (ref 3.4–5.0)
Alkaline Phosphatase: 113 U/L
Anion Gap: 11 (ref 7–16)
BILIRUBIN TOTAL: 0.3 mg/dL (ref 0.2–1.0)
BUN: 18 mg/dL (ref 7–18)
CALCIUM: 8.4 mg/dL — AB (ref 8.5–10.1)
CHLORIDE: 102 mmol/L (ref 98–107)
Co2: 28 mmol/L (ref 21–32)
Creatinine: 1.1 mg/dL (ref 0.60–1.30)
EGFR (African American): >60
EGFR (Non-African Amer.): 51 — ABNORMAL LOW
Glucose: 113 mg/dL — ABNORMAL HIGH (ref 65–99)
OSMOLALITY: 284 (ref 275–301)
POTASSIUM: 3.4 mmol/L — AB (ref 3.5–5.1)
SGOT(AST): 17 U/L (ref 15–37)
SGPT (ALT): 20 U/L
Sodium: 141 mmol/L (ref 136–145)
Total Protein: 6.7 g/dL (ref 6.4–8.2)

## 2014-08-21 LAB — CBC CANCER CENTER
BASOS PCT: 0.9 %
Basophil #: 0 x10 3/mm (ref 0.0–0.1)
Eosinophil #: 0 x10 3/mm (ref 0.0–0.7)
Eosinophil %: 0.8 %
HCT: 34.9 % — ABNORMAL LOW (ref 35.0–47.0)
HGB: 11.6 g/dL — AB (ref 12.0–16.0)
LYMPHS ABS: 0.5 x10 3/mm — AB (ref 1.0–3.6)
LYMPHS PCT: 10.9 %
MCH: 32.1 pg (ref 26.0–34.0)
MCHC: 33.1 g/dL (ref 32.0–36.0)
MCV: 97 fL (ref 80–100)
MONO ABS: 0.5 x10 3/mm (ref 0.2–0.9)
Monocyte %: 9.7 %
Neutrophil #: 3.9 x10 3/mm (ref 1.4–6.5)
Neutrophil %: 77.7 %
Platelet: 197 x10 3/mm (ref 150–440)
RBC: 3.6 10*6/uL — ABNORMAL LOW (ref 3.80–5.20)
RDW: 16.4 % — ABNORMAL HIGH (ref 11.5–14.5)
WBC: 5 x10 3/mm (ref 3.6–11.0)

## 2014-08-21 LAB — MAGNESIUM: MAGNESIUM: 0.9 mg/dL — AB

## 2014-08-27 LAB — MAGNESIUM: Magnesium: 1.1 mg/dL — ABNORMAL LOW

## 2014-08-30 ENCOUNTER — Ambulatory Visit: Payer: Self-pay | Admitting: Oncology

## 2014-08-30 ENCOUNTER — Encounter: Payer: Self-pay | Admitting: Oncology

## 2014-09-02 LAB — MAGNESIUM: Magnesium: 1.3 mg/dL — ABNORMAL LOW

## 2014-09-04 LAB — MAGNESIUM: Magnesium: 2.2 mg/dL

## 2014-09-11 LAB — MAGNESIUM: MAGNESIUM: 1.1 mg/dL — AB

## 2014-09-12 LAB — CREATININE, SERUM
CREATININE: 1.24 mg/dL (ref 0.60–1.30)
EGFR (Non-African Amer.): 44 — ABNORMAL LOW
GFR CALC AF AMER: 54 — AB

## 2014-09-13 LAB — MAGNESIUM: Magnesium: 2.2 mg/dL

## 2014-09-19 LAB — POTASSIUM: POTASSIUM: 3.4 mmol/L — AB (ref 3.5–5.1)

## 2014-09-19 LAB — MAGNESIUM: MAGNESIUM: 1.4 mg/dL — AB

## 2014-09-24 LAB — MAGNESIUM: Magnesium: 1.2 mg/dL — ABNORMAL LOW

## 2014-09-24 LAB — POTASSIUM: Potassium: 3.6 mmol/L (ref 3.5–5.1)

## 2014-09-26 LAB — MAGNESIUM: Magnesium: 2.2 mg/dL

## 2014-09-30 ENCOUNTER — Encounter: Payer: Self-pay | Admitting: Oncology

## 2014-09-30 ENCOUNTER — Ambulatory Visit: Payer: Self-pay | Admitting: Oncology

## 2014-10-29 ENCOUNTER — Ambulatory Visit
Admit: 2014-10-29 | Disposition: A | Discharge status: Home / Self Care | Payer: Self-pay | Attending: Oncology | Admitting: Oncology

## 2014-12-03 ENCOUNTER — Ambulatory Visit
Admit: 2014-12-03 | Disposition: A | Discharge status: Home / Self Care | Payer: Self-pay | Attending: Oncology | Admitting: Oncology

## 2014-12-19 LAB — COMPREHENSIVE METABOLIC PANEL
Albumin: 3.6 g/dL
Alkaline Phosphatase: 76 U/L
Anion Gap: 7 (ref 7–16)
BILIRUBIN TOTAL: 0.5 mg/dL
BUN: 18 mg/dL
CREATININE: 0.92 mg/dL
Calcium, Total: 8.9 mg/dL
Chloride: 106 mmol/L
Co2: 26 mmol/L
EGFR (African American): >60
GFR CALC NON AF AMER: 59 — AB
Glucose: 97 mg/dL
POTASSIUM: 3.6 mmol/L
SGOT(AST): 18 U/L
SGPT (ALT): 12 U/L — ABNORMAL LOW
SODIUM: 139 mmol/L
TOTAL PROTEIN: 6.8 g/dL

## 2014-12-19 LAB — CBC CANCER CENTER
BASOS PCT: 3.2 %
Basophil #: 0.1 x10 3/mm (ref 0.0–0.1)
Eosinophil #: 0.1 x10 3/mm (ref 0.0–0.7)
Eosinophil %: 3.5 %
HCT: 39 % (ref 35.0–47.0)
HGB: 13.1 g/dL (ref 12.0–16.0)
LYMPHS PCT: 24.5 %
Lymphocyte #: 0.9 x10 3/mm — ABNORMAL LOW (ref 1.0–3.6)
MCH: 31.1 pg (ref 26.0–34.0)
MCHC: 33.6 g/dL (ref 32.0–36.0)
MCV: 93 fL (ref 80–100)
MONOS PCT: 8.6 %
Monocyte #: 0.3 x10 3/mm (ref 0.2–0.9)
Neutrophil #: 2.3 x10 3/mm (ref 1.4–6.5)
Neutrophil %: 60.2 %
Platelet: 199 x10 3/mm (ref 150–440)
RBC: 4.21 10*6/uL (ref 3.80–5.20)
RDW: 14.3 % (ref 11.5–14.5)
WBC: 3.8 x10 3/mm (ref 3.6–11.0)

## 2014-12-19 LAB — MAGNESIUM: MAGNESIUM: 1.4 mg/dL — AB

## 2014-12-21 NOTE — Consult Note (Signed)
Reason for Visit: This 81 year old Female patient presents to the clinic for initial evaluation of  lung cancer .   Referred by Dr. Oliva Bustard.  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 CT scan confirmed right hilar and peribronchial mass concerning for malignancy. She underwent a PET/CT scan showed hypermetabolic activity in the right hilum and precarinal lymph nodes favoring malignancy.bronchoscopy was performed and tissue diagnosis was positive for small cell undifferentiated carcinoma. She scheduled an MRI scan of the brain for tomorrow to complete her workup. By PET CT all disease was confined to the chest. She is seen today for opinion regarding concurrent chemoradiation. She is doing fairly well she has a slight productive cough no hemoptysis. She does have significant COPD dyspnea on exertion.  Past Hx:    polycythemia:    hypercalcemia:    lung cancer:    Pulmonary nodule:    Hyperlipidemia:    Colon or Rectal Cancer:    COPD:    Asthma:    Tonsillectomy:    Cataract Extraction:    Colon Resection:   Past, Family and Social History:  Past Medical History positive   Cardiovascular hyperlipidemia   Respiratory asthma; COPD   Gastrointestinal history rectal cancer   Past Surgical History tonsillectomy, cataract extraction, colon resection with no adjuvant treatment   Past Medical History  Comments hypercalcemia, polycythemia   Family History positive   Family History Comments family history of lung cancer, melanoma and thyroid cancer   Social History positive   Social History Comments 50-pack-year smoker is currently on smoking cessation therapy. No EtOH abuse history   Additional Past Medical and Surgical History accompanied by her husband today   Allergies:   Iodine Strong: Rash  PCN: Rash  Neosporin: Rash  Benadryl: Fever  Home Meds:  Home Medications: Medication Instructions Status  Advair Diskus 250 mcg-50 mcg inhalation powder 1 puff(s) inhaled 2 times a day Active  calcium carbonate 500 mg oral tablet, chewable 1 tab(s) orally once a day Active  Ventolin HFA CFC free 90 mcg/inh inhalation aerosol 2 puff(s) inhaled 4 times a day, As Needed Active  Chantix Starter Pack 0.5 mg-1 mg oral tablet  orally  Active  Fish Oil oral capsule 1 cap(s) orally once a day Active  Vitamin D3 1000 intl units oral tablet 1 tab(s) orally once a day Active   Review of Systems:  General negative   Performance Status (ECOG) 0   Skin negative   Breast negative   Ophthalmologic negative   ENMT negative   Respiratory and Thorax see HPI   Cardiovascular negative   Gastrointestinal see HPI   Genitourinary negative   Musculoskeletal negative   Neurological negative   Psychiatric negative   Hematology/Lymphatics negative   Endocrine negative   Allergic/Immunologic negative   Review of Systems   review of systems obtained from nurses notes  Physical Exam:  General/Skin/HEENT:  General normal   Skin normal   Eyes normal  ENMT normal   Head and Neck normal   Additional PE well-developed well-nourished female in NAD. She is on nasal oxygen. Lungs are clear to A&P cardiac examination shows regular rate and rhythm. Abdomen is benign with no organomegaly or masses noted. No cervical or supraclavicular adenopathy is identified. She's had a port placed in  her right anterior chest wall.   Breasts/Resp/CV/GI/GU:  Respiratory and Thorax normal   Cardiovascular normal   Gastrointestinal normal   Genitourinary normal   MS/Neuro/Psych/Lymph:  Musculoskeletal normal   Neurological normal   Lymphatics normal   Other Results:  Radiology Results: LabUnknown:    19-May-15 16:03, CT Chest Without Contrast  PACS Image     28-May-15 10:46, PET/CT Scan Lung Cancer Diagnosis  PACS Image   CT:    19-May-15 16:03, CT Chest Without Contrast  CT Chest Without Contrast   REASON FOR EXAM:    Increased density seen on abnormal [facility] chest xray  COMMENTS:       PROCEDURE: KCT - KCT CHEST WITHOUT CONTRAST  - Jan 15 2014  4:03PM     CLINICAL DATA:  Abnormal chest x-ray with increased density,  congestion for 2-3 weeks, past history colon cancer with resection  in 2004    EXAM:  CT CHEST WITHOUT CONTRAST    TECHNIQUE:  Multidetector CT imaging of the chest was performed following the  standard protocol without IV contrast. Sagittal and coronal MPR  images reconstructed from axial data set.    COMPARISON:  06/25/2013    Correlation: Reportedly abnormal chest radiograph from [facility] not provided for comparison.    FINDINGS:  Scattered atherosclerotic calcifications aorta and coronary  arteries.    Tiny calculusat upper pole LEFT kidney image 57 unchanged.    Question small cyst at upper pole LEFT kidney 12 x 9 mm, unchanged.  Enlarged hila bilaterally, particularly RIGHT, with RIGHT hilar size  significantly increased since previous exam likely representing  adenopathy, unable to measure discrete nodes due to lack of IV  contrast.    Enlarged precarinal lymph node 17 mm short axis image 27 new.    Lungs appear hyperinflated with minimal central peribronchial  thickening.    4 mm nodular density LEFT lower lobe image 44 unchanged.    5 mm RIGHT upper lobe nodule image 21 not significantly changed.    3 mm LEFT upper  lobe nodule image 24, 4 mm previously.  Calcified granuloma LEFT upper lobe image 32 unchanged.    No new or enlarging pulmonary mass, nodule, acute infiltrate, or  pleural effusion.    Superior endplate compression deformity of L1 vertebral body,  appears old.     IMPRESSION:  Stable pulmonary nodules.    RIGHT hilar and precarinal adenopathy as above, new ; this could be  due to metastatic disease, reactive processes, lymphoma.    Underlying emphysematous and bronchitic changes.  Electronically Signed    By: Lavonia Dana M.D.    On: 01/15/2014 16:51         Verified By: Burnetta Sabin, M.D.,  Nuclear Med:    28-May-15 10:46, PET/CT Scan Lung Cancer Diagnosis  PET/CT Scan Lung Cancer Diagnosis   REASON FOR EXAM:    Abnormal chest CT Hilar lymphadenopathy  COMMENTS:       PROCEDURE: PET - PET/CT DX LUNG CA  - Jan 24 2014 10:46AM     CLINICAL DATA:  Initial treatment strategy for abnormal adenopathy  in the chest.    EXAM:  NUCLEAR MEDICINE PET SKULL BASE TO THIGH    TECHNIQUE:  13.0 mCi F-18 FDG was injected intravenously. Full-ring PET imaging  was performed from the skull base to thigh after the radiotracer. CT  data was obtained and used for attenuation correction and anatomic  localization.    FASTING BLOOD GLUCOSE:  Value: 75 mg/dl    COMPARISON:  Multiple exams, including 01/15/2014    FINDINGS:  NECK    No hypermetabolic lymph nodes in the neck.    CHEST    Precarinal lymph node short axis diameter 1.6 cm, maximum standard  uptake value 16.6. Right hilar lymph node short axis diameter  approximately 3.3 cm (although difficult separate from adjacent  vascular structures), maximum standard uptake value 20.0. This hilar  mass is associated with narrowing of the right middle lobe  tracheobronchial tree.    Faintly increased activity along the thoracic esophagus is likely  physiologic. Coronary atherosclerosis. Biapical pleural  parenchymal  scarring.    ABDOMEN/PELVIS    No abnormal hypermetabolic activity within the liver, pancreas,  adrenal glands, or spleen. No hypermetabolic lymph nodes in the  abdomen or pelvis. Incidental cholelithiasis. Aortoiliac  atherosclerotic vascular disease.  SKELETON    No focal hypermetabolic activity to suggest skeletal metastasis.     IMPRESSION:  1. Right hilar mass and precarinal lymph node are enlarged and  hypermetabolic, favoring malignancy. Bronchoscopy and tissue  diagnosis recommended.  2. Incidental findings include atherosclerosis and cholelithiasis.      Electronically Signed    By: Sherryl Barters M.D.    On: 01/24/2014 11:04     Verified By: Carron Curie, M.D.,   Relevent Results:   Relevant Scans and Labs PET CT scan CT scans reviewed   Assessment and Plan: Impression:   Limited stage small cell lung cancer of the right chest in 81 year old female concurrent chemoradiation with curative intent. Plan:   at this, I will await her MRI of the brain to rule out possibility of brain metastasis. She does hold his ACE small cell limited stage lung cancer would treat up to 6000 centigrade using radiation therapy along with concurrent chemotherapy. Based on the patient's poor lung functions and probability that her V. 20 will exceed 35% of normal lung volume I would choose to use IM RT treatment planning and delivery to reduce the dose to at least 10% what 3-D treatment therapy can deliver. Risks and benefits of treatment were reviewed with the patient and her husband. Side effects such as dysphasia secondary to radiation esophagitis, increased cough, alteration of blood counts, destruction of normal lung volume, and alteration of blood counts all were explained in detail to the patient. She seems to comprehend my treatment plan well. I have sent her for CT simulation next week. We'll review her MRI scans prior to treatment.  I would like to take this  opportunity for allowing me to participate in the care of your patient..  CC Referral:  cc: Dr. Ramonita Lab   Electronic Signatures: Baruch Gouty, Roda Shutters (MD)  (Signed 24-Jun-15 14:11)  Authored: HPI, Diagnosis, Past Hx, PFSH, Allergies, Home Meds, ROS, Physical Exam, Other Results, Relevent Results, Encounter Assessment and Plan, CC Referring Physician   Last Updated: 24-Jun-15 14:11 by Armstead Peaks (MD)

## 2014-12-21 NOTE — Consult Note (Signed)
PATIENT NAME:  Jacqueline Zamora, Jacqueline Zamora MR#:  [id number] DATE OF BIRTH:  May 27, 1933  DATE OF CONSULTATION:  05/22/2014  REFERRING PHYSICIAN:   CONSULTING PHYSICIAN:  Simonne Come. Inez Pilgrim, MD  HISTORY OF PRESENT ILLNESS:  Jacqueline Zamora is a cancer center patient primarily followed by Dr. Oliva Bustard with small cell cancer of the lung originally diagnosed in June, it was a stage III, T1N2, stage III cancer. The patient has had chemotherapy and radiation treatment, radiation finished August 23. Last cycle of chemotherapy was given on September 16, so this day of admission was day 8 of the current cycle of therapy.  The patient was admitted on September 22 with exacerbation of COPD and acute respiratory failure with tachycardia and a possible allergic reaction to Levaquin. She also had a urinary tract infection. Oncology was consulted and I saw her on September 23. A brief note was placed on the chart although this narrative was delayed until today.   As above, the patient was hospitalized with treatment with nebulizers, steroids, continuing Advair, Spiriva was added.  A V/Q scan was done and ruled out a pulmonary embolism.  The patient was also documented with a urinary tract infection, was given Rocephin and Lovenox for deep vein thrombosis prophylaxis.   PAST MEDICAL HISTORY:  In addition to small cell cancer includes COPD and prior polycythemia though currently is running anemic, has a history of hypercalcemia as well as hyperlipidemia, pulmonary nodules, and colon cancer and surgery back in 2004. History also includes cataract extraction, asthma, also hyperlipidemia, and tonsillectomy.    SMOKING:  She is a pack a day smoker.   ALLERGIES:  ALREADY BEEN NOTED ON ADMISSION NOTE TO BENADRYL, IODINE, NEOSPORIN PENICILLIN, AND NOW THOUGHT TO BE POSSIBLY LEVAQUIN BASED ON REACTION.   FAMILY HISTORY: Lung cancer, melanoma, and thyroid cancer.   SOCIAL HISTORY: No alcohol.   MEDICATIONS AT THE TIME OF ADMISSION: Fish oil,  vitamin D, Advair, calcium carbonate, Ventolin 2 puffs 4 times a day, B12 orally.   SYSTEM REVIEW: When I saw the patient she was comfortable, severe initial symptoms were better. She was not short of breath at rest in the bed. Had some general weakness. Did not have any headache or dizziness or visual disturbances. Was not having chills or sweats. Had some nonproductive cough. Was not having retrosternal chest pain or palpitations. No abdominal pain. No nausea, vomiting, diarrhea, notably she had nausea and vomiting the day before she came to the hospital, in the morning she had loose stools, this was all better. She has already taken in some p.o. and was hungry when I saw her, was looking forward to eating more. No dysuria or hematuria. No rash or bruising. No numbness or tingling in extremities or back pain.   PHYSICAL EXAMINATION:  VITAL SIGNS: Stable.  GENERAL:  Alert and cooperative, thin, in no acute distress. Pallor.  HEENT:  No thrush. No jaundice.  LUNGS:  Decreased air entry with faint end  expiratory wheezing, but not audible at the bedside. There were some rhonchi that partially cleared with cough as well.  HEART: Regular with tachycardia.  ABDOMEN: Nontender. No palpable mass.  EXTREMITIES: No edema.  LYMPHATIC:  No palpable lymph nodes in the neck, supraclavicular, submandibular  axillae.     PSYCHIATRIC:  Mood and affect were unremarkable.   LABORATORIES:  On admission the creatinine was 1.1, potassium was 3.7. Liver functions were normal. Troponin was low. White count was 5,000, hemoglobin 10, platelets 123,000. The urinalysis was abnormal with  high white count, esterase, and bacteria.  The chest x-ray showed some volume loss in the lung. The patient had underwent a V/Q scan reported.  That was reported as low probability. Laboratories on September 23, the white count was 3.4, neutrophils were 3.3, hemoglobin was 9.4, the platelets were 94,000. Creatinine was 1.1, potassium was 4.0,  calcium was 7.2, the albumin had been 2.6.   IMPRESSION AND PLAN: Patient with underlying lung cancer on treatment, currently was day 8 of treatment. There was mild thrombocytopenia and some anemia, but counts are unremarkable considering chemotherapy patient, no significant cytopenia, no indication for growth factors, no indication for transfusion. There was nothing acutely from oncology. The patient should have serial CBCs every day as the counts might drop. Particular attention to platelets and neutrophil count.    ____________________________ Simonne Come. Inez Pilgrim, MD rgg:bu D: 05/24/2014 18:36:24 ET T: 05/24/2014 19:25:08 ET JOB#: [id number]  cc: Simonne Come. Inez Pilgrim, MD, <Dictator> Dallas Schimke MD ELECTRONICALLY SIGNED 05/28/2014 14:17

## 2014-12-21 NOTE — H&P (Signed)
PATIENT NAME:  Jacqueline Zamora, Jacqueline Zamora MR#:  [id number] DATE OF BIRTH:  1932-11-20  DATE OF ADMISSION:  05/21/2014  PRIMARY CARE PROVIDER: Dr.   Loistine Chance PHYSICIAN: Dr. Edd Fabian.   CHIEF COMPLAINT: Initially nausea, vomiting, subsequently developed acute onset of shortness of breath.   HISTORY OF PRESENT ILLNESS: The patient is a 81 year old white female with stage III small cell carcinoma of the right lung diagnosed by bronchoscopy in June 2015. The patient was started on chemotherapy with cisplatin VP-16 in June 2015 with concurrent radiation therapy. Last chemotherapy was on 05/15/2014. She finished her radiation the end of September. The patient states that she did okay after the chemotherapy last Friday; however, yesterday started developing nausea and vomiting. Due to these symptoms, she came to the ED. The patient was noted to have a possible UTI and was started on IV Levaquin. Prior to starting IV Levaquin, the patient has had some shortness of breath at home with activity. However, once she received IV Levaquin her breathing got worse. She started having drops in oxygen saturation so she received multiple doses of nebulizers. Now her breathing is a little bit improved. Also, when she arrived her heart rate was in the 117. Her heart rate currently is at 137,000. The patient denies being on Levaquin previously. She denies any chest pains. She chronically has dyspnea on exertion, but is not on any oxygen therapy at home. Her nausea and vomiting started yesterday. She had not had any diarrhea. Denies any abdominal pain. Reports that her bowel movements are regular. Denies any lower extremity swelling or calf pain.   PAST MEDICAL HISTORY:  1.  History of small cell cancer of the right lung, stage III.  2.  Chronic obstructive pulmonary disease.  3.  Chronic tobacco abuse.  4.  History of polycythemia, currently is anemic.  5.  History of hypercalcemia.  6.  History of pulmonary nodule.  7.   Hyperlipidemia.  8.  History of colon cancer in 2004 status post surgery,   PAST SURGICAL HISTORY: Status post tonsillectomy, cataract extraction, colon resection.   ALLERGIES: BENADRYL, IODINE, NEOSPORIN, PENICILLIN, AND NOW POSSIBLE LEVAQUIN.   SMOKING HISTORY: Continues to smoke 1 pack per day. No alcohol or drug use.   FAMILY HISTORY: History of hypertension.   CURRENT MEDICATIONS AT HOME: She is on vitamin D3 at 1000 international units daily, vitamin B12 at 1000 mcg daily, Ventolin 2 puffs 4 times a day as needed, Zofran 4 mg 1 tab p.o. every 4 to 6 p.r.n., fish oil 1 tab p.o. daily, cranberry 1 tab p.o. daily, calcium carbonate 500 daily, Advair 250/50 one puff b.i.d.   REVIEW OF SYSTEMS: CONSTITUTIONAL: Denies any fevers. Complains of fatigue and weakness. Complains of weight loss.  EYES: No blurred or double vision. No redness. No inflammation. No glaucoma. No cataracts.  ENT: No tinnitus. No ear pain. No hearing loss. No seasonal or year-round allergies. No epistaxis. No difficulty swallowing.  RESPIRATORY: Complains of some cough, wheezing. Has COPD.   CARDIOVASCULAR: Denies any chest pain, orthopnea, edema, or arrhythmia.  GASTROINTESTINAL: No complaints of nausea or vomiting. No abdominal pain. No hematemesis. No melena. No guarding. No IBS. No jaundice.  GENITOURINARY: Denies any dysuria, hematuria, renal colic or frequency.  ENDOCRINE: Denies any polyuria, nocturia.  HEMATOLOGIC AND LYMPHATIC: History of polycythemia, but has had anemia now.  SKIN: No acne. No rash.  MUSCULOSKELETAL: Denies any pain in the neck, back or shoulder.  NEUROLOGIC: No numbness, CVA, TIA, or seizures.  PSYCHIATRIC:  No anxiety, insomnia, or ADD.   PHYSICAL EXAMINATION: VITAL SIGNS: Temperature 98.4, pulse 117, respirations 18, blood pressure 131/68, O2 96%.  GENERAL: The patient is a very frail-looking female. Chronically ill, currently not in any acute distress.  HEENT: Head atraumatic,  normocephalic. Pupils equally round, reactive to light and accommodation. There is no conjunctival pallor. No scleral icterus. Nasal exam shows no drainage or ulceration. Oropharynx is clear without any exudate.  NECK: Supple without any JVD. No thyromegaly.  CARDIOVASCULAR: Tachycardic. No murmurs, rubs, clicks, or gallops.  LUNGS: Has diminished breath sounds bilaterally with wheezing. There is no current accessory muscle usage.  CARDIOVASCULAR: Tachycardic. No murmurs, rubs, clicks, or gallops.  ABDOMEN: Soft, nontender, nondistended. Positive bowel sounds x 4.  EXTREMITIES: No clubbing, cyanosis, or edema.  SKIN: No rash.  LYMPH NODES: None palpable.  VASCULAR: Good DP, PT pulses.  PSYCHIATRIC: Not anxious or depressed.  NEUROLOGIC: Awake, alert, and oriented x 3. No focal deficits.   LABORATORY DATA: Glucose 115, BUN 29, creatinine 1.14, sodium 137, potassium 3.7, chloride 103, CO2 is 27. LFTs: Total protein 6.2, albumin 2.9, bilirubin total 0.5, alkaline phosphatase 72, AST 26, ALT 23, troponin less than 0.02. WBC 5.0, hemoglobin 10, platelet count 123,000. Urinalysis with leukocytes 3+, WBCs 153, bacteria 2+. Abdominal x-ray, which includes a PA chest, shows bowel gas pattern unremarkable, prominent liver, volume loss right middle lobe, underlying emphysema, no consolidation. EKG shows sinus tachycardia. No ST-T wave changes.   IMPRESSION: The patient is a 81 year old with lung cancer presented initially with nausea vomiting, subsequently short of breath.  1.  Acute respiratory failure, possibly due to chronic obstructive pulmonary disease flare, possible allergic reaction to Levaquin. The patient, however, was tachycardic on presentation even prior to receiving the Levaquin. At this time, we will treat her as a chronic obstructive pulmonary disease exacerbation with Xopenex nebulizers, Solu-Medrol. Continue her inhalers as taking at home. We also need to rule out pulmonary embolism in a  cancer patient, so will check a CT per PE if radiology will allow with her decrease in GFR.  2.  Acute on chronic obstructive pulmonary disease exacerbation. Will treat her with nebulizers, steroids. Continue Advair. I will add Spiriva to her current regimen.  3.  Sinus tachycardia. Will need to rule out pulmonary embolus. We will do p.r.n. Cardizem.  4.  History of lung cancer. Outpatient followup with her cancer MD.  5.  Urinary tract infection. Will treat her with Rocephin, obtain urine cultures.  6.  Nicotine addiction. Smoking cessation done, 4 minutes spent. Nicotine patch will be started.  7.  Miscellaneous. Will do Lovenox for deep vein thrombosis prophylaxis.   TIME SPENT ON DISCHARGE: 60 minutes spent.     ____________________________ Lafonda Mosses. Posey Pronto, MD shp:at D: 05/21/2014 16:41:57 ET T: 05/21/2014 17:04:37 ET JOB#: [id number]  cc: Mulki Roesler H. Posey Pronto, MD, <Dictator> Alric Seton MD ELECTRONICALLY SIGNED 05/31/2014 13:56

## 2014-12-21 NOTE — Discharge Summary (Signed)
PATIENT NAME:  Jacqueline Zamora, Jacqueline Zamora MR#:  [id number] DATE OF BIRTH:  Nov 03, 1932  DATE OF ADMISSION:  05/24/2014 DATE OF DISCHARGE:  05/29/2014  DISCHARGE DIAGNOSES: 1. Acute respiratory failure secondary to chronic obstructive pulmonary disease exacerbation.  2. History of lung cancer.  3. Klebsiella urinary tract infection.  4. Chronic tobacco use.   CHIEF COMPLAINT: Nausea, vomiting, shortness of breath.   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 She was started on chemotherapy with VP-16 and concurrent radiotherapy, but subsequently developed nausea and vomiting. The patient was initially seen in the ED and it was felt that she had a urinary tract infection and was started on IV Levaquin. She subsequently became short of breath at home with activity and she received nebulized bronchodilator therapy with some partial improvement. She was noted to be tachycardic and it was felt that she might have had an allergic reaction to Levaquin although this was not entirely clear.   PAST MEDICAL HISTORY: Significant for small cell lung cancer, right lung stage III, chronic obstructive pulmonary disease, chronic tobacco use, history of polycythemia, history of hypercalcemia, hyperlipidemia, and history of colon cancer. Please see H and P for other details.   HOSPITAL COURSE: The patient was admitted to [facility] and received bronchodilator therapy, and she was also started on IV antibiotics and subsequently switched to p.o. prednisone. Her potassium was replaced. Her urine cultures grew Klebsiella urinary tract infection and she was discharged home in stable condition. She was seen by physical therapy and home health nursing was arranged for her.   DISCHARGE MEDICATIONS:  The patient was discharged on a tapering scale of prednisone starting at 40 mg at 3 days and decreased to 10 mg for 3 days  until gone, Gabapentin 100 mg capsule once a day, diltiazem 180/24, one capsule once a day, cefuroxime 500 mg q. 12 for 7 days, nicotine patch one patch transdermally once a day, vitamin B12 at 1000 mcg once a day, cranberry one tablet once a day, Ventolin HFA 2 puffs q.i.d. p.r.n., calcium carbonate 5 mg once a day, Advair Diskus one puff b.i.d., vitamin D3 one tablet once a day and fish oil capsule one capsule a day.   The patient will be followed up in the clinic by Dr. Caryl Comes and she has been advised to call back with any questions or concerns.   Total time spent in discharging the patient: 35 minutes.    ____________________________ Tracie Harrier, MD vh:hh D: 05/29/2014 17:29:58 ET T: 05/30/2014 00:29:28 ET JOB#: [id number]  cc: Tracie Harrier, MD, <Dictator>

## 2014-12-21 NOTE — H&P (Signed)
PATIENT NAME:  Jacqueline Zamora, Jacqueline Zamora MR#:  [id number] DATE OF BIRTH:  May 23, 1933  DATE OF ADMISSION:  05/24/2014  PRIMARY CARE PHYSICIAN:  Adin Hector, MD    REFERRING PHYSICIAN:  Jimmye Norman, MD  CHIEF COMPLAINT: Low magnesium.   HISTORY OF PRESENT ILLNESS: A 81 year old Caucasian female with a history of stage III small cell lung cancer, COPD, pulmonary nodules, was sent to ED due to low magnesium. Actually, patient was just discharged from the hospital today after treatment of COPD exacerbation, and UTI. The patient was called by Dr. Cynda Acres due to low magnesium level, so patient came to the ED and she was found to have a low magnesium level less than 0.3. Dr. Jimmye Norman, ED physician ordered 4 grams magnesium IV with calcium. The patient denies any symptoms except mild cough, and sometimes wheezing.   PAST MEDICAL HISTORY: Stage III small cell lung cancer on the right side, COPD, chronic tobacco abuse, history of polycythemia, anemia, hypercalcemia, hyperlipidemia, history of colon cancer in 2004, status-post surgery.   SURGICAL HISTORY: Tonsillectomy, cataract extraction, colon resection.   SOCIAL HISTORY: Smokes 1 pack a day for many years. No alcohol drinking or illicit drugs.   FAMILY HISTORY: Hypertension.   ALLERGIES: BENADRYL, IODINE STRONG, LEVAQUIN, NEOSPORIN, PENICILLIN.   REVIEW OF SYSTEMS:   CONSTITUTIONAL: The patient denies any fever or chills. No headache or dizziness or weakness.  EYES: No double vision, blurry vision.   ENT: No postnasal drip, slurred speech or dysphagia.  CARDIOVASCULAR: No chest pain, palpitation, orthopnea, nocturnal dyspnea, no leg edema.  PULMONARY: Positive for cough, no sputum, no shortness of breath, but sometimes has wheezing, no hemoptysis.  GASTROINTESTINAL: No abdominal pain, nausea, vomiting, diarrhea, no melena, or bloody stool.  GENITOURINARY: No dysuria, hematuria, or incontinence.  SKIN: No rash or jaundice.  NEUROLOGIC: No syncope, loss of  consciousness, or seizure.  ENDOCRINE: No polyuria, polydipsia, heat, or cold intolerance.  HEMATOLOGY: No easy bruising or bleeding.   HOME MEDICATIONS: Vitamin D3 of 1000 international units 1 tablet once a day, vitamin B12 of 1000 mcg p.o. 1 tab once a day, Ventolin HFA CFC 19 mcg inhalation 2 puffs 4 times a day p.r.n. prednisone 40 mg p.o. daily x 3 days then taper, nicotine patch 21 mg per 24 hours patch once a day, gabapentin 100 mg 1 cap once a day, fish oil 1 cap once a day, diltiazem 180 mg p.o. daily, cranberry 1 tablet once a day, cefuroxime 500 mg p.o. every 12 hours, calcium carbonate 500 mg p.o. daily, Advair 250 mcg/50 mcg 1 puff b.i.d.   VITAL SIGNS: Temperature 97.9, blood pressure 147/72, pulse 124, with O2 saturation 94% on room air.   PHYSICAL EXAMINATION:  GENERAL: The patient is alert, awake, oriented, in no acute distress.  HEENT: Pupils round equal, reactive to light, and accommodation, moist oral mucosa, clear oropharynx.  NECK: Supple. No JVD or carotid bruit, no lymphadenopathy, no thyromegaly.  CARDIOVASCULAR: S1 and S2, regular rate and rhythm, no murmurs, or gallops.  PULMONARY: Bilateral air entry, there is expiratory wheezing, no crackles, or rales. No use of accessory muscle to breathe.  ABDOMEN: Soft. No distention or tenderness. No organomegaly. Bowel sounds present.  EXTREMITIES: No edema, clubbing, or cyanosis. No calf tenderness. Bilateral pedal pulses present.  SKIN: No rash or jaundice.  NEUROLOGIC: A and O x 3, no focal deficit, power 5/5, sensation intact.   DIAGNOSTIC DATA:  WBC 2.2, hemoglobin 9.1, platelets 51,000, glucose 142 BUN 28, creatinine 1.22; electrolytes  were normal except calcium 6.7, magnesium less than 0.3, phosphate 2.6, albumin 3.1. EKG shows sinus tachycardia with premature supraventricular complexes at 107 BPM.   IMPRESSIONS: 1.  Severe hypomagnesemia.  2.  Dehydration.  3.  Pancytopenia with worsening white blood cells, and  platelet.  4.  Chronic obstructive pulmonary disease.  5.  Lung cancer. 6.  Tobacco abuse.    PLAN OF TREATMENT:   1.  The patient will be admitted to medical floor with telemonitor. The patient was given magnesium 4 grams, I think the patient needs more magnesium IV, follow up magnesium level.  2.  For hypocalcemia,  patient was given calcium iv, we will continue p.o. follow up  levels.  3.  For pancytopenia, which is possibly due to chemotherapy, we will follow up CBC, and get a consult from Dr. Cynda Acres.  4.  For dehydration, we will start normal saline IV, follow up BMP.  5.  For COPD, we will continue Advair and nebulizer treatment and continue prednisone.  6.  For history of UTI during last admission, we will continue antibiotics.   I discussed the patient's condition and plan of treatment with the patient and the patient's son-in-law, patient wants FULL CODE.   TIME SPENT: About 50 minutes.    ____________________________ Demetrios Loll, MD qc:nt D: 05/24/2014 21:11:53 ET T: 05/24/2014 22:56:03 ET JOB#: [id number]  cc: Demetrios Loll, MD, <Dictator> Demetrios Loll MD ELECTRONICALLY SIGNED 05/27/2014 15:27

## 2014-12-21 NOTE — Discharge Summary (Signed)
PATIENT NAME:  Jacqueline Zamora, Jacqueline Zamora MR#:  [id number] DATE OF BIRTH:  March 30, 1933  DATE OF ADMISSION:  05/24/2014 DATE OF DISCHARGE:  05/29/2014  FINAL DIAGNOSES: 1. Pancytopenia secondary to chemotherapy.  2. Chronic obstructive pulmonary disease with acute exacerbation.  3. Klebsiella urinary tract infection.  4. Hypomagnesemia.  5. Hypokalemia due to recent chemotherapy.   HISTORY AND PHYSICAL: Please see dictated admission history and physical.  HOSPITAL COURSE:  The patient was readmitted after being discharged home and was found to have severely low electrolytes. These were replaced slowly. Oral intake improved, as well. She was maintained on antibiotics for recent COPD exacerbation, as well a urinary tract infection, and she remained afebrile with oxygen levels remaining stable. Physical therapy worked with the patient. The recommendation was for her to go home with home health nursing and physical therapy, which was arranged.   DISCHARGE INSTRUCTIONS: At this point, she will be discharged home in stable condition with physical activity up as tolerated. Home health PT and R.N. were arranged as noted above. She will follow a regular diet. She should use Ensure once a day.   DISCHARGE FOLLOWUP: She will follow up with Dr. Oliva Bustard within the next 1 to 2 weeks.   DISCHARGE MEDICATIONS:  1. Vitamin D 1000 units p.o. daily.  2. Advair 250/50 one puff b.i.d.  3. Calcium 500 mg p.o. daily. 4. Ventolin 2 puffs 4 times a day as needed for shortness of breath.  5. Cranberry tablet 1 p.o. daily.  6. Vitamin B12 1000 mcg p.o. daily.  7. NicoDerm patch 21 mg daily. 8. Cardizem CD 180 mg p.o. daily.  9. Neurontin 100 mg p.o. daily.  10. Prednisone taper starting at 20 mg daily, decreasing by 10 mg every 2 days until done.  11. Ceftin 500 mg p.o. b.i.d. x 2 days to complete a course.  12. Ranitidine 150 mg p.o. b.i.d.  13. Mag-Ox 400 mg p.o. daily.  14. Ensure 1 can daily.  15. DuoNeb SVNs 4 times a  day as needed for wheezing.   She will stop fish oil.    ____________________________ Adin Hector, MD bjk:JT D: 05/29/2014 08:12:53 ET T: 05/29/2014 09:25:20 ET JOB#: [id number]  cc: Adin Hector, MD, <Dictator> Ramonita Lab MD ELECTRONICALLY SIGNED 05/31/2014 16:07

## 2014-12-21 NOTE — Op Note (Signed)
PATIENT NAME:  Jacqueline Zamora, NACHREINER MR#:  [id number] DATE OF BIRTH:  November 20, 1932  DATE OF PROCEDURE:  02/19/2014  PREOPERATIVE DIAGNOSIS:  Lung cancer with poor venous access.   POSTOPERATIVE DIAGNOSIS:  Lung cancer with poor venous access.   PROCEDURES:  1.  Ultrasound guidance for vascular access, right internal jugular vein.  2.  Fluoroscopic guidance for placement of catheter.  3.  Placement of CT compatible Port-A-Cath, right internal jugular vein.   SURGEON:  Algernon Huxley, MD  ANESTHESIA:  Local with moderate conscious sedation.   FLUOROSCOPY TIME:  Less than 1 minute.   CONTRAST:  Zero.   ESTIMATED BLOOD LOSS:  Minimal.  INDICATION FOR PROCEDURE:  A 81 year old female with lung cancer scheduled to start chemotherapy tomorrow. We were asked to place the Port-A-Cath for durable venous access for her treatments with chemotherapy.  Risks and benefits were discussed. Informed consent was obtained.   DESCRIPTION OF THE PROCEDURE:  The patient was brought to the vascular and interventional radiology suite. The right neck and chest were sterilely prepped and draped, and a sterile surgical field was created. Ultrasound was used to help visualize a patent right internal jugular vein. This was then accessed under direct ultrasound guidance without difficulty with a Seldinger needle and a permanent image was recorded. A J-wire was placed. After skin nick and dilatation, the peel-away sheath was then placed over the wire. I then anesthetized an area under the clavicle approximately 2 fingerbreadths. A transverse incision was created and an inferior pocket was created with electrocautery and blunt dissection. The port was then brought onto the field, placed into the pocket and secured to the chest wall with 2 Prolene sutures. The catheter was connected to the port and tunneled from the subclavicular incision to the access site. Fluoroscopic guidance was used to cut the catheter to an appropriate length.  The catheter was then placed through the peel-away sheath and the peel-away sheath was removed. The catheter tip was parked in excellent location in the cavoatrial junction area. The pocket was then irrigated with antibiotic-impregnated saline and the wound was closed with a running 3-0 Vicryl and a 4-0 Monocryl. The access incision was closed with a single 4-0 Monocryl. The Huber needle was used to withdraw blood and flush the port with heparinized saline. Dermabond was then placed as a dressing. The patient tolerated the procedure well and was taken to the recovery room in stable condition.        ____________________________ Algernon Huxley, MD jsd:sg D: 02/19/2014 08:54:24 ET T: 02/19/2014 09:19:31 ET JOB#: [id number]  cc: Algernon Huxley, MD, <Dictator> Algernon Huxley MD ELECTRONICALLY SIGNED 03/04/2014 15:06

## 2014-12-21 NOTE — Consult Note (Signed)
Brief Consult Note: Diagnosis: small cell lung cancer.   Patient was seen by consultant.   Comments: patient seen and chart reviewed earlier this am. vss no acute distress. currently day 8 of cycle 4 of chemotx, prior completed xrt end of aug. cbc mild cytopenia, consistent with recent tx. nothing acutely from oncology. follow cbc dasily, counts may fall steadily next few days, dictated note to follow.  Electronic Signatures: Dallas Schimke (MD)  (Signed 23-Sep-15 23:52)  Authored: Brief Consult Note   Last Updated: 23-Sep-15 23:52 by Dallas Schimke (MD)

## 2014-12-21 NOTE — Discharge Summary (Signed)
PATIENT NAME:  Jacqueline Zamora, Jacqueline Zamora MR#:  [id number] DATE OF BIRTH:  March 17, 1933  DATE OF ADMISSION:  05/21/2014 DATE OF DISCHARGE:  05/24/2014  DISCHARGE DIAGNOSES: 1. Acute respiratory failure secondary to chronic obstructive pulmonary disease exacerbation.  2. History of lung cancer.  3. Klebsiella urinary tract infection.  4. Chronic tobacco use.   CHIEF COMPLAINT: Nausea, vomiting, shortness of breath.   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 She was started on chemotherapy with VP-16 and concurrent radiotherapy, but subsequently developed nausea and vomiting. The patient was initially seen in the ED and it was felt that she had a urinary tract infection and was started on IV Levaquin. She subsequently became short of breath at home with activity and she received nebulized bronchodilator therapy with some partial improvement. She was noted to be tachycardic and it was felt that she might have had an allergic reaction to Levaquin although this was not entirely clear.   PAST MEDICAL HISTORY: Significant for small cell lung cancer, right lung stage III, chronic obstructive pulmonary disease, chronic tobacco use, history of polycythemia, history of hypercalcemia, hyperlipidemia, and history of colon cancer. Please see H and P for other details.   HOSPITAL COURSE: The patient was admitted to [facility] and received bronchodilator therapy, and she was also started on IV antibiotics and subsequently switched to p.o. prednisone. Her potassium was replaced. Her urine cultures grew Klebsiella urinary tract infection and she was discharged home in stable condition. She was seen by physical therapy and home health nursing was arranged for her.   DISCHARGE MEDICATIONS:  The patient was discharged on a tapering scale of prednisone starting at 40 mg at 3 days and decreased to 10 mg for 3 days  until gone, Gabapentin 100 mg capsule once a day, diltiazem 180/24, one capsule once a day, cefuroxime 500 mg q. 12 for 7 days, nicotine patch one patch transdermally once a day, vitamin B12 at 1000 mcg once a day, cranberry one tablet once a day, Ventolin HFA 2 puffs q.i.d. p.r.n., calcium carbonate 5 mg once a day, Advair Diskus one puff b.i.d., vitamin D3 one tablet once a day and fish oil capsule one capsule a day.   The patient will be followed up in the clinic by Dr. Caryl Comes and she has been advised to call back with any questions or concerns.   Total time spent in discharging the patient: 35 minutes.    ____________________________ Tracie Harrier, MD vh:hh D: 05/29/2014 17:29:00 ET T: 05/30/2014 00:29:28 ET JOB#: [id number]  cc: Tracie Harrier, MD, <Dictator> Tracie Harrier MD ELECTRONICALLY SIGNED 06/06/2014 13:10

## 2015-01-30 ENCOUNTER — Inpatient Hospital Stay: Payer: Medicare Other | Attending: Oncology

## 2015-01-30 DIAGNOSIS — C3490 Malignant neoplasm of unspecified part of unspecified bronchus or lung: Secondary | ICD-10-CM | POA: Diagnosis not present

## 2015-01-30 DIAGNOSIS — Z452 Encounter for adjustment and management of vascular access device: Secondary | ICD-10-CM | POA: Insufficient documentation

## 2015-01-30 DIAGNOSIS — C801 Malignant (primary) neoplasm, unspecified: Secondary | ICD-10-CM

## 2015-01-30 MED ORDER — HEPARIN SOD (PORK) LOCK FLUSH 100 UNIT/ML IV SOLN
500.0000 [IU] | Freq: Once | INTRAVENOUS | Status: AC
  Administered 2015-01-30: 500 [IU] via INTRAVENOUS

## 2015-01-30 MED ORDER — HEPARIN SOD (PORK) LOCK FLUSH 100 UNIT/ML IV SOLN
INTRAVENOUS | Status: AC
  Filled 2015-01-30: qty 5

## 2015-01-30 MED ORDER — SODIUM CHLORIDE 0.9 % IJ SOLN
10.0000 mL | Freq: Once | INTRAMUSCULAR | Status: AC
  Administered 2015-01-30: 10 mL via INTRAVENOUS
  Filled 2015-01-30: qty 10

## 2015-03-19 ENCOUNTER — Other Ambulatory Visit: Payer: Self-pay | Admitting: *Deleted

## 2015-03-19 DIAGNOSIS — C3490 Malignant neoplasm of unspecified part of unspecified bronchus or lung: Secondary | ICD-10-CM

## 2015-03-20 ENCOUNTER — Encounter: Payer: Self-pay | Admitting: Oncology

## 2015-03-20 ENCOUNTER — Inpatient Hospital Stay: Payer: Medicare Other | Attending: Oncology | Admitting: Oncology

## 2015-03-20 ENCOUNTER — Inpatient Hospital Stay: Payer: Medicare Other

## 2015-03-20 VITALS — BP 106/68 | HR 109 | Temp 97.5°F | Wt 120.4 lb

## 2015-03-20 DIAGNOSIS — Z85118 Personal history of other malignant neoplasm of bronchus and lung: Secondary | ICD-10-CM

## 2015-03-20 DIAGNOSIS — Z808 Family history of malignant neoplasm of other organs or systems: Secondary | ICD-10-CM

## 2015-03-20 DIAGNOSIS — R918 Other nonspecific abnormal finding of lung field: Secondary | ICD-10-CM | POA: Diagnosis not present

## 2015-03-20 DIAGNOSIS — D751 Secondary polycythemia: Secondary | ICD-10-CM

## 2015-03-20 DIAGNOSIS — J45909 Unspecified asthma, uncomplicated: Secondary | ICD-10-CM | POA: Insufficient documentation

## 2015-03-20 DIAGNOSIS — E785 Hyperlipidemia, unspecified: Secondary | ICD-10-CM | POA: Insufficient documentation

## 2015-03-20 DIAGNOSIS — C3490 Malignant neoplasm of unspecified part of unspecified bronchus or lung: Secondary | ICD-10-CM

## 2015-03-20 DIAGNOSIS — Z801 Family history of malignant neoplasm of trachea, bronchus and lung: Secondary | ICD-10-CM | POA: Insufficient documentation

## 2015-03-20 DIAGNOSIS — F17210 Nicotine dependence, cigarettes, uncomplicated: Secondary | ICD-10-CM | POA: Insufficient documentation

## 2015-03-20 DIAGNOSIS — Z923 Personal history of irradiation: Secondary | ICD-10-CM | POA: Insufficient documentation

## 2015-03-20 DIAGNOSIS — Z85038 Personal history of other malignant neoplasm of large intestine: Secondary | ICD-10-CM | POA: Diagnosis not present

## 2015-03-20 DIAGNOSIS — J449 Chronic obstructive pulmonary disease, unspecified: Secondary | ICD-10-CM

## 2015-03-20 DIAGNOSIS — Z9221 Personal history of antineoplastic chemotherapy: Secondary | ICD-10-CM | POA: Insufficient documentation

## 2015-03-20 LAB — COMPREHENSIVE METABOLIC PANEL
ALT: 10 U/L — AB (ref 14–54)
AST: 18 U/L (ref 15–41)
Albumin: 3.7 g/dL (ref 3.5–5.0)
Alkaline Phosphatase: 80 U/L (ref 38–126)
Anion gap: 5 (ref 5–15)
BILIRUBIN TOTAL: 0.5 mg/dL (ref 0.3–1.2)
BUN: 24 mg/dL — AB (ref 6–20)
CALCIUM: 8.2 mg/dL — AB (ref 8.9–10.3)
CO2: 28 mmol/L (ref 22–32)
CREATININE: 1.05 mg/dL — AB (ref 0.44–1.00)
Chloride: 103 mmol/L (ref 101–111)
GFR calc Af Amer: 57 mL/min — ABNORMAL LOW (ref >60)
GFR calc non Af Amer: 49 mL/min — ABNORMAL LOW (ref >60)
Glucose, Bld: 112 mg/dL — ABNORMAL HIGH (ref 65–99)
POTASSIUM: 3.7 mmol/L (ref 3.5–5.1)
Sodium: 136 mmol/L (ref 135–145)
TOTAL PROTEIN: 6.8 g/dL (ref 6.5–8.1)

## 2015-03-20 LAB — CBC WITH DIFFERENTIAL/PLATELET
BASOS ABS: 0.1 10*3/uL (ref 0–0.1)
Basophils Relative: 3 %
EOS ABS: 0.2 10*3/uL (ref 0–0.7)
Eosinophils Relative: 4 %
HCT: 39.3 % (ref 35.0–47.0)
Hemoglobin: 13 g/dL (ref 12.0–16.0)
Lymphocytes Relative: 28 %
Lymphs Abs: 1.2 10*3/uL (ref 1.0–3.6)
MCH: 30.4 pg (ref 26.0–34.0)
MCHC: 33.1 g/dL (ref 32.0–36.0)
MCV: 91.7 fL (ref 80.0–100.0)
MONOS PCT: 9 %
Monocytes Absolute: 0.4 10*3/uL (ref 0.2–0.9)
Neutro Abs: 2.4 10*3/uL (ref 1.4–6.5)
Neutrophils Relative %: 56 %
Platelets: 188 10*3/uL (ref 150–440)
RBC: 4.29 MIL/uL (ref 3.80–5.20)
RDW: 14.5 % (ref 11.5–14.5)
WBC: 4.3 10*3/uL (ref 3.6–11.0)

## 2015-03-20 LAB — MAGNESIUM: Magnesium: 1.3 mg/dL — ABNORMAL LOW (ref 1.7–2.4)

## 2015-03-20 MED ORDER — MAGNESIUM SULFATE 2 GM/50ML IV SOLN
2.0000 g | Freq: Once | INTRAVENOUS | Status: AC
  Administered 2015-03-20: 2 g via INTRAVENOUS
  Filled 2015-03-20: qty 50

## 2015-03-20 MED ORDER — HEPARIN SOD (PORK) LOCK FLUSH 100 UNIT/ML IV SOLN
INTRAVENOUS | Status: AC
  Filled 2015-03-20: qty 5

## 2015-03-20 NOTE — Progress Notes (Signed)
Patient does not have living will.  Declined information.  Current smoker.

## 2015-03-21 ENCOUNTER — Encounter: Payer: Self-pay | Admitting: Oncology

## 2015-03-21 DIAGNOSIS — E538 Deficiency of other specified B group vitamins: Secondary | ICD-10-CM | POA: Insufficient documentation

## 2015-03-21 DIAGNOSIS — E785 Hyperlipidemia, unspecified: Secondary | ICD-10-CM | POA: Insufficient documentation

## 2015-03-21 DIAGNOSIS — J449 Chronic obstructive pulmonary disease, unspecified: Secondary | ICD-10-CM | POA: Insufficient documentation

## 2015-03-21 DIAGNOSIS — D751 Secondary polycythemia: Secondary | ICD-10-CM | POA: Insufficient documentation

## 2015-03-21 DIAGNOSIS — C3490 Malignant neoplasm of unspecified part of unspecified bronchus or lung: Secondary | ICD-10-CM | POA: Insufficient documentation

## 2015-03-21 DIAGNOSIS — E559 Vitamin D deficiency, unspecified: Secondary | ICD-10-CM | POA: Insufficient documentation

## 2015-03-21 NOTE — Progress Notes (Signed)
[place] @ [facility] [phone]  Fax:([phone] [place]: [birth year]  MR#: [id number]  QPY#:[id number]  Patient Care Team: Vivien Rota, MD as PCP - General (Internal Medicine)  CHIEF COMPLAINT:  Chief Complaint  Patient presents with  . 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 T1, N2, M0 tumor stage III Diagnoses by bronchoscopy in June of 2015 2.  Chronic tobacco abuse and long-term COPD 3.started chemotherapy with cis-platinum VP-16 June of 2015 . with concurrent radiation treatment 4.last chemotherapy was on September 16,2015   finished radiation therapy by end of September 2015.Marland Kitchen Prophylactic brain radiation has been discontinued because of progressive weakness in both lower extremity (November, 2015)     SCC of lung (small cell carcinoma)    81 year old lady with a history of small cell undifferentiated carcinoma of lung status post chemoradiation therapy INTERVAL HISTORY: Jacqueline Zamora came today for further follow-up.  Since last evaluation she has been doing fairly good.  Continues to lose weight.  Appetite has improved.  No nausea.  No vomiting.  No cough.  Patient continues to smoke. No hemoptysis chest pain.  Hypomagnesemia remains a problem REVIEW OF SYSTEMS:    general status: Patient is feeling weak and tired.  No change in a performance status.  No chills.  No fever. HEENT: Alopecia.  No evidence of stomatitis Lungs: No cough or shortness of breath. Continues to smoke Cardiac: No chest pain or paroxysmal nocturnal dyspnea GI: No nausea no vomiting no diarrhea no abdominal pain Skin: No rash Lower extremity no swelling Neurological system: No tingling.  No numbness.  No other focal signs Musculoskeletal system no bony pains  As per HPI. Otherwise, a complete review of systems is negatve.   Significant History/PMH:   post menopausal:    polycythemia:    hypercalcemia:      lung cancer:    Pulmonary nodule:    Hyperlipidemia:    Colon or Rectal Cancer:    COPD:    Asthma:    Tonsillectomy:    Cataract Extraction:    Colon Resection:   Smoking History: Smoking History 0.5 Packs per day and has received prescription for chantix. Smoking Cessation Information Given to Patient .  PFSH: Comments: h/o of lung cancer or melanoma and thyroid cancer in the family  Social History: negative alcohol  Additional Past Medical and Surgical History: no significant history of any other medical illness  In 2004 patient had carcinoma of colon underwent surgery and no followup therapy    ADVANCED DIRECTIVES:  Patient does have advance healthcare directive, Patient   does not desire to make any changes  HEALTH MAINTENANCE: History  Substance Use Topics  . Smoking status: Current Every Day Smoker  . Smokeless tobacco: Not on file  . Alcohol Use: Not on file      Allergies  Allergen Reactions  . Neomycin-Bacitracin Zn-Polymyx Rash  . Other Rash    Iodine Cleansing Solution and Dye  . Penicillins Rash    Current Outpatient Prescriptions  Medication Sig Dispense Refill  . albuterol (PROAIR HFA) 108 (90 BASE) MCG/ACT inhaler Inhale into the lungs.    . ALPRAZolam (XANAX) 0.25 MG tablet     . ALPRAZolam (XANAX) 0.25 MG tablet Take by mouth.    . Butenafine HCl 1 % cream Apply topically.    . fluticasone (FLONASE) 50 MCG/ACT nasal spray Place into the nose.    Marland Kitchen Fluticasone-Salmeterol (  ADVAIR) 500-50 MCG/DOSE AEPB Inhale into the lungs.    Marland Kitchen ipratropium-albuterol (DUONEB) 0.5-2.5 (3) MG/3ML SOLN     . Cholecalciferol (D 2000) 2000 UNITS TABS Take by mouth.    . Cholecalciferol (VITAMIN D-1000 MAX ST) 1000 UNITS tablet Take by mouth.    . Cyanocobalamin (RA VITAMIN B-12 TR) 1000 MCG TBCR Take by mouth.     No current facility-administered medications for this visit.    OBJECTIVE:  Filed Vitals:   03/20/15 1417  BP: 106/68  Pulse: 109  Temp:  97.5 F (36.4 C)     There is no height on file to calculate BMI.    ECOG FS:1 - Symptomatic but completely ambulatory  PHYSICAL EXAM: GENERAL status: Performance status is good.  Patient has not lost significant weight HEENT: No evidence of stomatitis. Sclera and conjunctivae :: No jaundice.   pale looking. Lungs: Air  entry equal on both sides.  No rhonchi.  No rales.  Cardiac: Heart sounds are normal.  No pericardial rub.  No murmur. Lymphatic system: Cervical, axillary, inguinal, lymph nodes not palpable GI: Abdomen is soft.  No ascites.  Liver spleen not palpable.  No tenderness.  Bowel sounds are within normal limit Lower extremity: No edema Neurological system: Higher functions, cranial nerves intact no evidence of peripheral neuropathy. Skin: No rash.  No ecchymosis.Marland Kitchen   LAB RESULTS:  Infusion on 03/20/2015  Component Date Value Ref Range Status  . WBC 03/20/2015 4.3  3.6 - 11.0 K/uL Final  . RBC 03/20/2015 4.29  3.80 - 5.20 MIL/uL Final  . Hemoglobin 03/20/2015 13.0  12.0 - 16.0 g/dL Final  . HCT 03/20/2015 39.3  35.0 - 47.0 % Final  . MCV 03/20/2015 91.7  80.0 - 100.0 fL Final  . MCH 03/20/2015 30.4  26.0 - 34.0 pg Final  . MCHC 03/20/2015 33.1  32.0 - 36.0 g/dL Final  . RDW 03/20/2015 14.5  11.5 - 14.5 % Final  . Platelets 03/20/2015 188  150 - 440 K/uL Final  . Neutrophils Relative % 03/20/2015 56   Final  . Neutro Abs 03/20/2015 2.4  1.4 - 6.5 K/uL Final  . Lymphocytes Relative 03/20/2015 28   Final  . Lymphs Abs 03/20/2015 1.2  1.0 - 3.6 K/uL Final  . Monocytes Relative 03/20/2015 9   Final  . Monocytes Absolute 03/20/2015 0.4  0.2 - 0.9 K/uL Final  . Eosinophils Relative 03/20/2015 4   Final  . Eosinophils Absolute 03/20/2015 0.2  0 - 0.7 K/uL Final  . Basophils Relative 03/20/2015 3   Final  . Basophils Absolute 03/20/2015 0.1  0 - 0.1 K/uL Final  . Sodium 03/20/2015 136  135 - 145 mmol/L Final  . Potassium 03/20/2015 3.7  3.5 - 5.1 mmol/L Final  . Chloride  03/20/2015 103  101 - 111 mmol/L Final  . CO2 03/20/2015 28  22 - 32 mmol/L Final  . Glucose, Bld 03/20/2015 112* 65 - 99 mg/dL Final  . BUN 03/20/2015 24* 6 - 20 mg/dL Final  . Creatinine, Ser 03/20/2015 1.05* 0.44 - 1.00 mg/dL Final  . Calcium 03/20/2015 8.2* 8.9 - 10.3 mg/dL Final  . Total Protein 03/20/2015 6.8  6.5 - 8.1 g/dL Final  . Albumin 03/20/2015 3.7  3.5 - 5.0 g/dL Final  . AST 03/20/2015 18  15 - 41 U/L Final  . ALT 03/20/2015 10* 14 - 54 U/L Final  . Alkaline Phosphatase 03/20/2015 80  38 - 126 U/L Final  . Total Bilirubin 03/20/2015 0.5  0.3 - 1.2 mg/dL Final  . GFR calc non Af Amer 03/20/2015 49* >60 mL/min Final  . GFR calc Af Amer 03/20/2015 57* >60 mL/min Final   Comment: (NOTE) The eGFR has been calculated using the CKD EPI equation. This calculation has not been validated in all clinical situations. eGFR's persistently <60 mL/min signify possible Chronic Kidney Disease.   . Anion gap 03/20/2015 5  5 - 15 Final  . Magnesium 03/20/2015 1.3* 1.7 - 2.4 mg/dL Final      ASSESSMENT: Small cell undifferentiated carcinoma of lung on clinical grounds there is no evidence of recurrent disease during next evaluation a CT scan of the chest has been recommended Hypomagnesemia  MEDICAL DECISION MAKING:  iv   MAG .  Recheck magnesium level in now 4 weeks.  Patient was encouraged to increase oral magnesium however patient says she gets diarrhea. Repeat CT scan Patient was again counseled regarding smoking   Patient expressed understanding and was in agreement with this plan. She also understands that She can call clinic at any time with any questions, concerns, or complaints.    No matching staging information was found for the patient.  Forest Gleason, MD   03/21/2015 3:27 PM

## 2015-04-09 ENCOUNTER — Ambulatory Visit: Payer: Self-pay

## 2015-04-09 ENCOUNTER — Ambulatory Visit: Payer: Self-pay | Admitting: Radiation Oncology

## 2015-04-17 ENCOUNTER — Inpatient Hospital Stay: Payer: Medicare Other

## 2015-04-17 ENCOUNTER — Ambulatory Visit
Admission: RE | Admit: 2015-04-17 | Discharge: 2015-04-17 | Disposition: A | Discharge status: Home / Self Care | Payer: Medicare Other | Source: Ambulatory Visit | Attending: Radiation Oncology | Admitting: Radiation Oncology

## 2015-04-17 ENCOUNTER — Inpatient Hospital Stay: Payer: Medicare Other | Attending: Hematology and Oncology

## 2015-04-17 ENCOUNTER — Encounter: Payer: Self-pay | Admitting: Radiation Oncology

## 2015-04-17 VITALS — BP 109/73 | HR 107 | Temp 96.1°F | Resp 20 | Wt 119.2 lb

## 2015-04-17 DIAGNOSIS — Z85118 Personal history of other malignant neoplasm of bronchus and lung: Secondary | ICD-10-CM

## 2015-04-17 DIAGNOSIS — C3490 Malignant neoplasm of unspecified part of unspecified bronchus or lung: Secondary | ICD-10-CM

## 2015-04-17 LAB — MAGNESIUM: MAGNESIUM: 1.6 mg/dL — AB (ref 1.7–2.4)

## 2015-04-17 MED ORDER — DEXTROSE 5 % IV SOLN
1.0000 g | Freq: Once | INTRAVENOUS | Status: AC
  Administered 2015-04-17: 1 g via INTRAVENOUS
  Filled 2015-04-17: qty 2

## 2015-04-17 MED ORDER — HEPARIN SOD (PORK) LOCK FLUSH 100 UNIT/ML IV SOLN
500.0000 [IU] | Freq: Once | INTRAVENOUS | Status: AC
  Administered 2015-04-17: 500 [IU]
  Filled 2015-04-17: qty 5

## 2015-04-17 MED ORDER — SODIUM CHLORIDE 0.9 % IJ SOLN
10.0000 mL | INTRAMUSCULAR | Status: AC | PRN
  Administered 2015-04-17: 10 mL
  Filled 2015-04-17: qty 10

## 2015-04-17 NOTE — Progress Notes (Signed)
Radiation Oncology Follow up Note  Name: Jacqueline Zamora   Date:   04/17/2015 MRN:  [id number] DOB: 09-28-1932    This 81 y.o. 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  REFERRING PROVIDER: Vivien Rota, MD  HPI: Patient is a 81 year old female now out close to 1 year having completed combined modality treatment with chemotherapy and radiation therapy to her chest as well as prophylactic cranial irradiation for limited stage III (T1 N2 M0) small cell lung cancer. She is seen today in routine follow-up and is doing well. No evidence of cognitive decline. No cough hemoptysis or chest tightness. CT scan back in January shows. Excellent response to therapy. She scheduled for another CT scan next month.  COMPLICATIONS OF TREATMENT: none  FOLLOW UP COMPLIANCE: keeps appointments   PHYSICAL EXAM:  BP 109/73 mmHg  Pulse 107  Temp(Src) 96.1 F (35.6 C)  Resp 20  Wt 119 lb 2.5 oz (54.05 kg) Well-developed well-nourished patient in NAD. HEENT reveals PERLA, EOMI, discs not visualized.  Oral cavity is clear. No oral mucosal lesions are identified. Neck is clear without evidence of cervical or supraclavicular adenopathy. Lungs are clear to A&P. Cardiac examination is essentially unremarkable with regular rate and rhythm without murmur rub or thrill. Abdomen is benign with no organomegaly or masses noted. Motor sensory and DTR levels are equal and symmetric in the upper and lower extremities. Cranial nerves II through XII are grossly intact. Proprioception is intact. No peripheral adenopathy or edema is identified. No motor or sensory levels are noted. Crude visual fields are within normal range.   RADIOLOGY RESULTS: Prior CT scans are reviewed and compatible with the above-stated findings  PLAN: Present time she continues to do well with no evidence of disease. I'm  please were overall progress. I have asked to see her back in 1 year for follow-up. She continues close follow-up care with medical oncology. She is a rescheduled follow-up CT scans. Patient knows to call with any concerns.  I would like to take this opportunity for allowing me to participate in the care of your patient.Armstead Peaks., MD

## 2015-05-01 ENCOUNTER — Encounter: Payer: Self-pay | Admitting: *Deleted

## 2015-05-02 ENCOUNTER — Ambulatory Visit: Payer: Medicare Other | Admitting: Anesthesiology

## 2015-05-02 ENCOUNTER — Encounter
Admission: RE | Disposition: A | Discharge status: Home / Self Care | Payer: Self-pay | Source: Ambulatory Visit | Attending: Gastroenterology

## 2015-05-02 ENCOUNTER — Ambulatory Visit
Admission: RE | Admit: 2015-05-02 | Discharge: 2015-05-02 | Disposition: A | Discharge status: Home / Self Care | Payer: Medicare Other | Source: Ambulatory Visit | Attending: Gastroenterology | Admitting: Gastroenterology

## 2015-05-02 DIAGNOSIS — F17210 Nicotine dependence, cigarettes, uncomplicated: Secondary | ICD-10-CM | POA: Insufficient documentation

## 2015-05-02 DIAGNOSIS — Z79899 Other long term (current) drug therapy: Secondary | ICD-10-CM | POA: Diagnosis not present

## 2015-05-02 DIAGNOSIS — Z85038 Personal history of other malignant neoplasm of large intestine: Secondary | ICD-10-CM | POA: Insufficient documentation

## 2015-05-02 DIAGNOSIS — Z880 Allergy status to penicillin: Secondary | ICD-10-CM | POA: Diagnosis not present

## 2015-05-02 DIAGNOSIS — Z888 Allergy status to other drugs, medicaments and biological substances status: Secondary | ICD-10-CM | POA: Diagnosis not present

## 2015-05-02 DIAGNOSIS — E538 Deficiency of other specified B group vitamins: Secondary | ICD-10-CM | POA: Diagnosis not present

## 2015-05-02 DIAGNOSIS — J449 Chronic obstructive pulmonary disease, unspecified: Secondary | ICD-10-CM | POA: Insufficient documentation

## 2015-05-02 DIAGNOSIS — E559 Vitamin D deficiency, unspecified: Secondary | ICD-10-CM | POA: Diagnosis not present

## 2015-05-02 DIAGNOSIS — D128 Benign neoplasm of rectum: Secondary | ICD-10-CM | POA: Diagnosis not present

## 2015-05-02 DIAGNOSIS — R1310 Dysphagia, unspecified: Secondary | ICD-10-CM | POA: Insufficient documentation

## 2015-05-02 DIAGNOSIS — D120 Benign neoplasm of cecum: Secondary | ICD-10-CM | POA: Diagnosis not present

## 2015-05-02 DIAGNOSIS — E785 Hyperlipidemia, unspecified: Secondary | ICD-10-CM | POA: Diagnosis not present

## 2015-05-02 DIAGNOSIS — K5730 Diverticulosis of large intestine without perforation or abscess without bleeding: Secondary | ICD-10-CM | POA: Diagnosis not present

## 2015-05-02 DIAGNOSIS — Z923 Personal history of irradiation: Secondary | ICD-10-CM | POA: Insufficient documentation

## 2015-05-02 DIAGNOSIS — Z85118 Personal history of other malignant neoplasm of bronchus and lung: Secondary | ICD-10-CM | POA: Insufficient documentation

## 2015-05-02 HISTORY — PX: COLONOSCOPY WITH PROPOFOL: SHX5780

## 2015-05-02 HISTORY — DX: Secondary polycythemia: D75.1

## 2015-05-02 HISTORY — DX: Malignant (primary) neoplasm, unspecified: C80.1

## 2015-05-02 HISTORY — DX: Vitamin D deficiency, unspecified: E55.9

## 2015-05-02 HISTORY — PX: ESOPHAGOGASTRODUODENOSCOPY (EGD) WITH PROPOFOL: SHX5813

## 2015-05-02 HISTORY — DX: Hypercalcemia: E83.52

## 2015-05-02 HISTORY — DX: Pneumonia, unspecified organism: J18.9

## 2015-05-02 HISTORY — DX: Hyperlipidemia, unspecified: E78.5

## 2015-05-02 HISTORY — DX: Solitary pulmonary nodule: R91.1

## 2015-05-02 HISTORY — DX: Chronic obstructive pulmonary disease, unspecified: J44.9

## 2015-05-02 HISTORY — DX: Deficiency of other specified B group vitamins: E53.8

## 2015-05-02 SURGERY — COLONOSCOPY WITH PROPOFOL
Anesthesia: General

## 2015-05-02 MED ORDER — SODIUM CHLORIDE 0.9 % IV SOLN
INTRAVENOUS | Status: DC
  Administered 2015-05-02: 09:00:00 via INTRAVENOUS

## 2015-05-02 MED ORDER — MIDAZOLAM HCL 2 MG/2ML IJ SOLN
INTRAMUSCULAR | Status: DC | PRN
  Administered 2015-05-02: 1 mg via INTRAVENOUS

## 2015-05-02 MED ORDER — PROPOFOL 10 MG/ML IV BOLUS
INTRAVENOUS | Status: DC | PRN
  Administered 2015-05-02 (×9): 20 mg via INTRAVENOUS

## 2015-05-02 MED ORDER — SODIUM CHLORIDE 0.9 % IV SOLN
INTRAVENOUS | Status: DC
  Administered 2015-05-02: 1000 mL via INTRAVENOUS

## 2015-05-02 NOTE — H&P (Signed)
Primary Care Physician:  Vivien Rota, MD Primary Gastroenterologist:  Dr. Candace Cruise  Pre-Procedure History & Physical: HPI:  Jacqueline Zamora is a 81 y.o. female is here for an EGD/colonoscopy.   Past Medical History  Diagnosis Date  . COPD (chronic obstructive pulmonary disease)   . Hypercalcemia   . Polycythemia   . Hyperlipidemia   . Vitamin D deficiency   . Vitamin B12 deficiency   . Pulmonary nodule   . Cancer     Small Cell Lung Cancer (RT)  . Pneumonia     Past Surgical History  Procedure Laterality Date  . Tonsillectomy    . Colon surgery      Sigmoid Colectomy  . Cataract extraction    . Eye surgery      Cataract Extraction 2008    Prior to Admission medications   Medication Sig Start Date End Date Taking? Authorizing Provider  diltiazem (DILTIAZEM CD) 120 MG 24 hr capsule Take 120 mg by mouth daily.   Yes Historical Provider, MD  albuterol (PROAIR HFA) 108 (90 BASE) MCG/ACT inhaler Inhale into the lungs. 07/01/14 07/01/15  Historical Provider, MD  ALPRAZolam Duanne Moron) 0.25 MG tablet  12/19/14   Historical Provider, MD  ALPRAZolam Duanne Moron) 0.25 MG tablet Take by mouth. 12/19/14   Historical Provider, MD  Butenafine HCl 1 % cream Apply topically. 01/15/15 01/15/16  Historical Provider, MD  Cholecalciferol (D 2000) 2000 UNITS TABS Take by mouth.    Historical Provider, MD  Cholecalciferol (VITAMIN D-1000 MAX ST) 1000 UNITS tablet Take by mouth.    Historical Provider, MD  Cyanocobalamin (RA VITAMIN B-12 TR) 1000 MCG TBCR Take by mouth.    Historical Provider, MD  fluticasone (FLONASE) 50 MCG/ACT nasal spray Place into the nose. 01/15/15 01/15/16  Historical Provider, MD  Fluticasone-Salmeterol (ADVAIR) 500-50 MCG/DOSE AEPB Inhale into the lungs. 05/30/14   Historical Provider, MD  ipratropium-albuterol (DUONEB) 0.5-2.5 (3) MG/3ML SOLN  05/29/14   Historical Provider, MD    Allergies as of 04/01/2015 - Review Complete 03/21/2015  Allergen Reaction Noted  .  Neomycin-bacitracin zn-polymyx Rash 03/20/2015  . Other Rash 03/20/2015  . Penicillins Rash 03/20/2015    History reviewed. No pertinent family history.  Social History   Social History  . Marital Status: Single    Spouse Name: N/A  . Number of Children: N/A  . Years of Education: N/A   Occupational History  . Not on file.   Social History Main Topics  . Smoking status: Current Every Day Smoker -- 1.00 packs/day for 62 years    Types: Cigarettes  . Smokeless tobacco: Never Used  . Alcohol Use: No  . Drug Use: No  . Sexual Activity: Not on file   Other Topics Concern  . Not on file   Social History Narrative    Review of Systems: See HPI, otherwise negative ROS  Physical Exam: BP 102/73 mmHg  Pulse 106  Temp(Src) 96.7 F (35.9 C) (Tympanic)  Resp 18  Ht '5\' 4"'$  (1.626 m)  Wt 54.432 kg (120 lb)  BMI 20.59 kg/m2  SpO2 96% General:   Alert,  pleasant and cooperative in NAD Head:  Normocephalic and atraumatic. Neck:  Supple; no masses or thyromegaly. Lungs:  Clear throughout to auscultation.    Heart:  Regular rate and rhythm. Abdomen:  Soft, nontender and nondistended. Normal bowel sounds, without guarding, and without rebound.   Neurologic:  Alert and  oriented x4;  grossly normal neurologically.  Impression/Plan: Jacqueline Zamora  is here for an EGD/colonoscopy to be performed for personal hx of colon cancer and dysphagia. Risks, benefits, limitations, and alternatives regarding EGD/colonoscopy  have been reviewed with the patient.  Questions have been answered.  All parties agreeable.   Ander Wamser, Lupita Dawn, MD  05/02/2015, 8:37 AM

## 2015-05-02 NOTE — Op Note (Signed)
[facility] Gastroenterology Patient Name: Jacqueline Zamora Procedure Date: 05/02/2015 9:18 AM MRN: [id number] Account #: [id number] Date of Birth: [birth year] Admit Type: Outpatient Age: 81 Room: [facility] ENDO ROOM 4 Gender: Female Note Status: Finalized Procedure:         Upper GI endoscopy Indications:       Dysphagia, S/P XRT Providers:         Lupita Dawn. Candace Cruise, MD Referring MD:      Forest Gleason Md, MD (Referring MD) Medicines:         Monitored Anesthesia Care Complications:     No immediate complications. Procedure:         Pre-Anesthesia Assessment:                    - Prior to the procedure, a History and Physical was                     performed, and patient medications, allergies and                     sensitivities were reviewed. The patient's tolerance of                     previous anesthesia was reviewed.                    - The risks and benefits of the procedure and the sedation                     options and risks were discussed with the patient. All                     questions were answered and informed consent was obtained.                    - After reviewing the risks and benefits, the patient was                     deemed in satisfactory condition to undergo the procedure.                    After obtaining informed consent, the endoscope was passed                     under direct vision. Throughout the procedure, the                     patient's blood pressure, pulse, and oxygen saturations                     were monitored continuously. The Endoscope was introduced                     through the mouth, and advanced to the second part of                     duodenum. The upper GI endoscopy was accomplished without                     difficulty. The patient tolerated the procedure well. The                     upper GI endoscopy was accomplished without difficulty.  The patient tolerated the procedure well. Findings:      The  examined esophagus was normal.      The entire examined stomach was normal.      The examined duodenum was normal. Impression:        - Normal esophagus.                    - Normal stomach.                    - Normal examined duodenum.                    - No specimens collected. Recommendation:    - Discharge patient to home.                    - Observe patient's clinical course.                    - The findings and recommendations were discussed with the                     patient. Procedure Code(s): --- Professional ---                    [phone], Esophagogastroduodenoscopy, flexible, transoral;                     diagnostic, including collection of specimen(s) by                     brushing or washing, when performed (separate procedure) Diagnosis Code(s): --- Professional ---                    R13.10, Dysphagia, unspecified CPT copyright 2014 American Medical Association. All rights reserved. The codes documented in this report are preliminary and upon coder review may  be revised to meet current compliance requirements. Hulen Luster, MD 05/02/2015 9:31:41 AM This report has been signed electronically. Number of Addenda: 0 Note Initiated On: 05/02/2015 9:18 AM      [facility]

## 2015-05-02 NOTE — Anesthesia Preprocedure Evaluation (Addendum)
Anesthesia Evaluation  Patient identified by MRN, date of birth, ID band Patient awake    Reviewed: Allergy & Precautions, H&P , NPO status , Patient's Chart, lab work & pertinent test results, reviewed documented beta blocker date and time   Airway Mallampati: II  TM Distance: >3 FB Neck ROM: full    Dental no notable dental hx. (+) Teeth Intact, Upper Dentures, Lower Dentures   Pulmonary neg pulmonary ROS, pneumonia -, COPDCurrent Smoker,    Pulmonary exam normal       Cardiovascular Exercise Tolerance: Good negative cardio ROS  Rhythm:regular Rate:Normal     Neuro/Psych negative neurological ROS  negative psych ROS   GI/Hepatic negative GI ROS, Neg liver ROS,   Endo/Other  negative endocrine ROS  Renal/GU negative Renal ROS  negative genitourinary   Musculoskeletal   Abdominal   Peds  Hematology negative hematology ROS (+)   Anesthesia Other Findings   Reproductive/Obstetrics negative OB ROS                            Anesthesia Physical Anesthesia Plan  ASA: III  Anesthesia Plan: General   Post-op Pain Management:    Induction:   Airway Management Planned:   Additional Equipment:   Intra-op Plan:   Post-operative Plan:   Informed Consent: I have reviewed the patients History and Physical, chart, labs and discussed the procedure including the risks, benefits and alternatives for the proposed anesthesia with the patient or authorized representative who has indicated his/her understanding and acceptance.   Dental Advisory Given  Plan Discussed with: CRNA  Anesthesia Plan Comments:         Anesthesia Quick Evaluation

## 2015-05-02 NOTE — Op Note (Signed)
[facility]astroenterology Patient Name: Jacqueline Zamora Procedure Date: 05/02/2015 9:14 AM MRN: [id number] Account #: [id number] Date of Birth: [birth year] Admit Type: Outpatient Age: 81 Room: [facility] ENDO ROOM 4 Gender: Female Note Status: Finalized Procedure:         Colonoscopy Indications:       Personal history of malignant neoplasm of the colon Providers:         Lupita Dawn. Candace Cruise, MD Referring MD:      Forest Gleason Md, MD (Referring MD) Medicines:         Monitored Anesthesia Care Complications:     No immediate complications. Procedure:         Pre-Anesthesia Assessment:                    - Prior to the procedure, a History and Physical was                     performed, and patient medications, allergies and                     sensitivities were reviewed. The patient's tolerance of                     previous anesthesia was reviewed.                    - The risks and benefits of the procedure and the sedation                     options and risks were discussed with the patient. All                     questions were answered and informed consent was obtained.                    - After reviewing the risks and benefits, the patient was                     deemed in satisfactory condition to undergo the procedure.                    After obtaining informed consent, the colonoscope was                     passed under direct vision. Throughout the procedure, the                     patient's blood pressure, pulse, and oxygen saturations                     were monitored continuously. The Colonoscope was                     introduced through the anus and advanced to the the cecum,                     identified by appendiceal orifice and ileocecal valve. The                     colonoscopy was performed without difficulty. The patient                     tolerated the procedure well. The quality of the bowel  preparation was good. Findings:  Multiple small and large-mouthed diverticula were found in the sigmoid       colon.      A small polyp was found in the cecum. The polyp was sessile. The polyp       was removed with a cold snare. Resection and retrieval were complete.      A small polyp was found in the rectum. The polyp was sessile. The polyp       was removed with a cold snare. Resection and retrieval were complete.      The exam was otherwise without abnormality. Impression:        - Diverticulosis in the sigmoid colon.                    - One small polyp in the cecum. Resected and retrieved.                    - One small polyp in the rectum. Resected and retrieved.                    - The examination was otherwise normal. Recommendation:    - Discharge patient to home.                    - Await pathology results.                    - The findings and recommendations were discussed with the                     patient. Procedure Code(s): --- Professional ---                    [phone], Colonoscopy, flexible; with removal of tumor(s),                     polyp(s), or other lesion(s) by snare technique Diagnosis Code(s): --- Professional ---                    D12.0, Benign neoplasm of cecum                    K62.1, Rectal polyp                    Z85.038, Personal history of other malignant neoplasm of                     large intestine                    K57.30, Diverticulosis of large intestine without                     perforation or abscess without bleeding CPT copyright 2014 American Medical Association. All rights reserved. The codes documented in this report are preliminary and upon coder review may  be revised to meet current compliance requirements. Hulen Luster, MD 05/02/2015 9:51:45 AM This report has been signed electronically. Number of Addenda: 0 Note Initiated On: 05/02/2015 9:14 AM Scope Withdrawal Time: 0 hours 8 minutes 51 seconds  Total Procedure Duration: 0 hours 15 minutes 57 seconds        [facility]

## 2015-05-06 LAB — SURGICAL PATHOLOGY

## 2015-05-12 ENCOUNTER — Encounter: Payer: Self-pay | Admitting: Gastroenterology

## 2015-05-12 NOTE — Transfer of Care (Signed)
Immediate Anesthesia Transfer of Care Note  Patient: Jacqueline Zamora  Procedure(s) Performed: Procedure(s): COLONOSCOPY WITH PROPOFOL (N/A) ESOPHAGOGASTRODUODENOSCOPY (EGD) WITH PROPOFOL (N/A)  Patient Location: PACU and Endoscopy Unit  Anesthesia Type:General  Level of Consciousness: sedated  Airway & Oxygen Therapy: Patient Spontanous Breathing and Patient connected to nasal cannula oxygen  Post-op Assessment: Report given to RN and Post -op Vital signs reviewed and stable  Post vital signs: stable  Last Vitals:  Filed Vitals:   05/02/15 1020  BP: 133/77  Pulse: 86  Temp:   Resp: 18    Complications: No apparent anesthesia complications

## 2015-05-12 NOTE — Anesthesia Postprocedure Evaluation (Signed)
  Anesthesia Post-op Note  Patient: Jacqueline Zamora  Procedure(s) Performed: Procedure(s): COLONOSCOPY WITH PROPOFOL (N/A) ESOPHAGOGASTRODUODENOSCOPY (EGD) WITH PROPOFOL (N/A)  Anesthesia type:General  Patient location: PACU  Post pain: Pain level controlled  Post assessment: Post-op Vital signs reviewed, Patient's Cardiovascular Status Stable, Respiratory Function Stable, Patent Airway and No signs of Nausea or vomiting  Post vital signs: Reviewed and stable  Last Vitals:  Filed Vitals:   05/02/15 1020  BP: 133/77  Pulse: 86  Temp:   Resp: 18    Level of consciousness: awake, alert  and patient cooperative  Complications: No apparent anesthesia complications

## 2015-05-13 ENCOUNTER — Inpatient Hospital Stay: Payer: Medicare Other | Attending: Oncology

## 2015-05-13 ENCOUNTER — Ambulatory Visit
Admission: RE | Admit: 2015-05-13 | Discharge: 2015-05-13 | Disposition: A | Discharge status: Home / Self Care | Payer: Medicare Other | Source: Ambulatory Visit | Attending: Oncology | Admitting: Oncology

## 2015-05-13 ENCOUNTER — Telehealth: Payer: Self-pay | Admitting: *Deleted

## 2015-05-13 ENCOUNTER — Other Ambulatory Visit: Payer: Self-pay | Admitting: *Deleted

## 2015-05-13 DIAGNOSIS — Z85118 Personal history of other malignant neoplasm of bronchus and lung: Secondary | ICD-10-CM | POA: Insufficient documentation

## 2015-05-13 DIAGNOSIS — C3490 Malignant neoplasm of unspecified part of unspecified bronchus or lung: Secondary | ICD-10-CM

## 2015-05-13 DIAGNOSIS — Z9049 Acquired absence of other specified parts of digestive tract: Secondary | ICD-10-CM | POA: Insufficient documentation

## 2015-05-13 DIAGNOSIS — Z85038 Personal history of other malignant neoplasm of large intestine: Secondary | ICD-10-CM | POA: Diagnosis not present

## 2015-05-13 DIAGNOSIS — F17210 Nicotine dependence, cigarettes, uncomplicated: Secondary | ICD-10-CM | POA: Insufficient documentation

## 2015-05-13 DIAGNOSIS — Z923 Personal history of irradiation: Secondary | ICD-10-CM | POA: Diagnosis not present

## 2015-05-13 DIAGNOSIS — J449 Chronic obstructive pulmonary disease, unspecified: Secondary | ICD-10-CM | POA: Diagnosis not present

## 2015-05-13 DIAGNOSIS — D751 Secondary polycythemia: Secondary | ICD-10-CM | POA: Insufficient documentation

## 2015-05-13 DIAGNOSIS — R634 Abnormal weight loss: Secondary | ICD-10-CM | POA: Insufficient documentation

## 2015-05-13 DIAGNOSIS — Z9221 Personal history of antineoplastic chemotherapy: Secondary | ICD-10-CM | POA: Diagnosis not present

## 2015-05-13 LAB — CBC WITH DIFFERENTIAL/PLATELET
BASOS ABS: 0.1 10*3/uL (ref 0–0.1)
BASOS PCT: 3 %
EOS ABS: 0.1 10*3/uL (ref 0–0.7)
Eosinophils Relative: 2 %
HEMATOCRIT: 40.5 % (ref 35.0–47.0)
HEMOGLOBIN: 13.7 g/dL (ref 12.0–16.0)
Lymphocytes Relative: 24 %
Lymphs Abs: 1.2 10*3/uL (ref 1.0–3.6)
MCH: 30.9 pg (ref 26.0–34.0)
MCHC: 33.8 g/dL (ref 32.0–36.0)
MCV: 91.4 fL (ref 80.0–100.0)
Monocytes Absolute: 0.4 10*3/uL (ref 0.2–0.9)
Monocytes Relative: 8 %
NEUTROS ABS: 3.1 10*3/uL (ref 1.4–6.5)
NEUTROS PCT: 63 %
Platelets: 179 10*3/uL (ref 150–440)
RBC: 4.43 MIL/uL (ref 3.80–5.20)
RDW: 14.6 % — ABNORMAL HIGH (ref 11.5–14.5)
WBC: 4.9 10*3/uL (ref 3.6–11.0)

## 2015-05-13 LAB — COMPREHENSIVE METABOLIC PANEL
ALBUMIN: 3.7 g/dL (ref 3.5–5.0)
ALK PHOS: 71 U/L (ref 38–126)
ALT: 12 U/L — ABNORMAL LOW (ref 14–54)
ANION GAP: 4 — AB (ref 5–15)
AST: 19 U/L (ref 15–41)
BILIRUBIN TOTAL: 0.6 mg/dL (ref 0.3–1.2)
BUN: 23 mg/dL — AB (ref 6–20)
CALCIUM: 8.5 mg/dL — AB (ref 8.9–10.3)
CO2: 29 mmol/L (ref 22–32)
Chloride: 103 mmol/L (ref 101–111)
Creatinine, Ser: 1.02 mg/dL — ABNORMAL HIGH (ref 0.44–1.00)
GFR calc Af Amer: 59 mL/min — ABNORMAL LOW (ref >60)
GFR calc non Af Amer: 51 mL/min — ABNORMAL LOW (ref >60)
GLUCOSE: 92 mg/dL (ref 65–99)
POTASSIUM: 3.6 mmol/L (ref 3.5–5.1)
SODIUM: 136 mmol/L (ref 135–145)
TOTAL PROTEIN: 6.9 g/dL (ref 6.5–8.1)

## 2015-05-13 LAB — MAGNESIUM: Magnesium: 1.5 mg/dL — ABNORMAL LOW (ref 1.7–2.4)

## 2015-05-13 NOTE — Telephone Encounter (Signed)
Pt is allergic to dye and did not get prep for CT, do you wan tit done without contrast or to reschedule it? Per Dr Oliva Bustard, it needs to be rescheduled, CT informed, Hildred Alamin will be informed to order prep and scan

## 2015-05-13 NOTE — Telephone Encounter (Signed)
Dye prep prescription faxed to pharmacy. CT scan rescheduled and orders have been entered.

## 2015-05-15 ENCOUNTER — Other Ambulatory Visit: Payer: Medicare Other

## 2015-05-15 ENCOUNTER — Ambulatory Visit: Payer: Medicare Other

## 2015-05-15 ENCOUNTER — Ambulatory Visit: Payer: Medicare Other | Admitting: Hematology and Oncology

## 2015-05-16 ENCOUNTER — Telehealth: Payer: Self-pay | Admitting: *Deleted

## 2015-05-16 NOTE — Telephone Encounter (Signed)
Called to states that mothers CT rescheduled to Monday and her allergy med for dye has not been sent to [place].  I called [facility] who confirmed that they hav ethe rx adn has been ready to pick up then I called daughter back and let her know it has been threre and ready to pick up. She said she will let her mother know

## 2015-05-19 ENCOUNTER — Ambulatory Visit
Admission: RE | Admit: 2015-05-19 | Discharge: 2015-05-19 | Disposition: A | Discharge status: Home / Self Care | Payer: Medicare Other | Source: Ambulatory Visit | Attending: Oncology | Admitting: Oncology

## 2015-05-19 DIAGNOSIS — J9811 Atelectasis: Secondary | ICD-10-CM | POA: Diagnosis not present

## 2015-05-19 DIAGNOSIS — C3490 Malignant neoplasm of unspecified part of unspecified bronchus or lung: Secondary | ICD-10-CM | POA: Diagnosis present

## 2015-05-19 DIAGNOSIS — I2510 Atherosclerotic heart disease of native coronary artery without angina pectoris: Secondary | ICD-10-CM | POA: Diagnosis not present

## 2015-05-19 DIAGNOSIS — I700 Atherosclerosis of aorta: Secondary | ICD-10-CM | POA: Insufficient documentation

## 2015-05-19 DIAGNOSIS — J439 Emphysema, unspecified: Secondary | ICD-10-CM | POA: Diagnosis not present

## 2015-05-19 DIAGNOSIS — R918 Other nonspecific abnormal finding of lung field: Secondary | ICD-10-CM | POA: Insufficient documentation

## 2015-05-19 MED ORDER — IOHEXOL 300 MG/ML  SOLN
75.0000 mL | Freq: Once | INTRAMUSCULAR | Status: AC | PRN
  Administered 2015-05-19: 75 mL via INTRAVENOUS

## 2015-05-20 ENCOUNTER — Inpatient Hospital Stay: Payer: Medicare Other

## 2015-05-20 ENCOUNTER — Encounter: Payer: Self-pay | Admitting: Oncology

## 2015-05-20 ENCOUNTER — Inpatient Hospital Stay (HOSPITAL_BASED_OUTPATIENT_CLINIC_OR_DEPARTMENT_OTHER): Payer: Medicare Other | Admitting: Oncology

## 2015-05-20 VITALS — BP 107/65 | HR 84 | Temp 94.2°F | Wt 118.1 lb

## 2015-05-20 DIAGNOSIS — R634 Abnormal weight loss: Secondary | ICD-10-CM

## 2015-05-20 DIAGNOSIS — C3490 Malignant neoplasm of unspecified part of unspecified bronchus or lung: Secondary | ICD-10-CM

## 2015-05-20 DIAGNOSIS — J449 Chronic obstructive pulmonary disease, unspecified: Secondary | ICD-10-CM

## 2015-05-20 DIAGNOSIS — F17210 Nicotine dependence, cigarettes, uncomplicated: Secondary | ICD-10-CM

## 2015-05-20 DIAGNOSIS — Z9221 Personal history of antineoplastic chemotherapy: Secondary | ICD-10-CM

## 2015-05-20 DIAGNOSIS — D751 Secondary polycythemia: Secondary | ICD-10-CM

## 2015-05-20 DIAGNOSIS — Z85118 Personal history of other malignant neoplasm of bronchus and lung: Secondary | ICD-10-CM

## 2015-05-20 DIAGNOSIS — Z923 Personal history of irradiation: Secondary | ICD-10-CM

## 2015-05-20 DIAGNOSIS — Z85038 Personal history of other malignant neoplasm of large intestine: Secondary | ICD-10-CM

## 2015-05-20 DIAGNOSIS — Z9049 Acquired absence of other specified parts of digestive tract: Secondary | ICD-10-CM

## 2015-05-20 MED ORDER — MAGNESIUM SULFATE 2 GM/50ML IV SOLN
2.0000 g | Freq: Once | INTRAVENOUS | Status: AC
  Administered 2015-05-20: 2 g via INTRAVENOUS
  Filled 2015-05-20: qty 50

## 2015-05-20 MED ORDER — SODIUM CHLORIDE 0.9 % IJ SOLN
10.0000 mL | Freq: Once | INTRAMUSCULAR | Status: AC
  Administered 2015-05-20: 10 mL via INTRAVENOUS
  Filled 2015-05-20: qty 10

## 2015-05-20 MED ORDER — HEPARIN SOD (PORK) LOCK FLUSH 100 UNIT/ML IV SOLN
500.0000 [IU] | Freq: Once | INTRAVENOUS | Status: AC
  Administered 2015-05-20: 500 [IU] via INTRAVENOUS
  Filled 2015-05-20: qty 5

## 2015-05-20 NOTE — Progress Notes (Signed)
[place] @ [facility] [phone]  Fax:(336) [place]: Oct 26, 1932  MR#: [id number]  JYN#:[id number]  Patient Care Team: Vivien Rota, MD as PCP - General (Internal Medicine)  CHIEF COMPLAINT:  Chief Complaint  Patient presents with  . 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  Chronic tobacco abuse and long-term COPD 3.started chemotherapy with cis-platinum VP-16 June of 2015 . with concurrent radiation treatment 4.last chemotherapy was on September 16,2015   finished radiation therapy by end of September 2015.Marland Kitchen Prophylactic brain radiation has been discontinued because of progressive weakness in both lower extremity (November, 2015)     SCC of lung (small cell carcinoma)    81 year old lady with a history of small cell undifferentiated carcinoma of lung status post chemoradiation therapy INTERVAL HISTORY: Mrs. Schlueter came today for further follow-up.  Since last evaluation she has been doing fairly good.  Continues to lose weight.  Appetite has improved.  No nausea.  No vomiting.  No cough.  Patient continues to smoke. No hemoptysis chest pain.  Hypomagnesemia remains a problem May 19, 2015 Patient still continues to smoke and trying to quit smoking Had a CT scan done Hypomagnesemia is a problem then he magnesium level is 1.5.  Patient has very poor tolerance to oral magnesium causing diarrhea No cough or shortness of breath or hemoptysis or chest pain Appetite has been stable Has not lost any weight REVIEW OF SYSTEMS:    general status:   No change in a performance status.  No chills.  No fever. HEENT: Alopecia.  No evidence of stomatitis Lungs: No cough or shortness of breath. Continues to smoke Cardiac: No chest pain or paroxysmal nocturnal dyspnea GI: No nausea no vomiting no diarrhea no abdominal pain Skin: No  rash Lower extremity no swelling Neurological system: No tingling.  No numbness.  No other focal signs Musculoskeletal system no bony pains Has not lost any weight.  Appetite has been stable. As per HPI. Otherwise, a complete review of systems is negatve.   Significant History/PMH:   post menopausal:    polycythemia:    hypercalcemia:    lung cancer:    Pulmonary nodule:    Hyperlipidemia:    Colon or Rectal Cancer:    COPD:    Asthma:    Tonsillectomy:    Cataract Extraction:    Colon Resection:   Smoking History: Smoking History 0.5 Packs per day and has received prescription for chantix. Smoking Cessation Information Given to Patient .  PFSH: Comments: h/o of lung cancer or melanoma and thyroid cancer in the family  Social History: negative alcohol  Additional Past Medical and Surgical History: no significant history of any other medical illness  In 2004 patient had carcinoma of colon underwent surgery and no followup therapy    ADVANCED DIRECTIVES:  Patient does have advance healthcare directive, Patient   does not desire to make any changes  HEALTH MAINTENANCE: Social History  Substance Use Topics  . Smoking status: Current Every Day Smoker -- 1.00 packs/day for 62 years    Types: Cigarettes  . Smokeless tobacco: Never Used  . Alcohol Use: No      Allergies  Allergen Reactions  . Neomycin-Bacitracin Zn-Polymyx Rash  . Other Rash    Iodine Cleansing Solution and Dye  . Penicillins Rash    Current Outpatient Prescriptions  Medication Sig Dispense Refill  . albuterol (PROAIR HFA) 108 (90 BASE) MCG/ACT inhaler Inhale into the lungs.    . ALPRAZolam (XANAX) 0.25 MG tablet Take by mouth.    . Butenafine HCl 1 % cream Apply topically.    . Cholecalciferol (VITAMIN D-1000 MAX ST) 1000 UNITS tablet Take by mouth.    . Cyanocobalamin (RA VITAMIN B-12 TR) 1000 MCG TBCR Take by mouth.    . diltiazem (DILTIAZEM CD) 120 MG 24 hr capsule Take 120 mg by  mouth daily.    . fluticasone (FLONASE) 50 MCG/ACT nasal spray Place into the nose.    Marland Kitchen Fluticasone-Salmeterol (ADVAIR) 500-50 MCG/DOSE AEPB Inhale into the lungs.    Marland Kitchen ipratropium-albuterol (DUONEB) 0.5-2.5 (3) MG/3ML SOLN     . ALPRAZolam (XANAX) 0.25 MG tablet     . Cholecalciferol (D 2000) 2000 UNITS TABS Take by mouth.     No current facility-administered medications for this visit.   Facility-Administered Medications Ordered in Other Visits  Medication Dose Route Frequency Provider Last Rate Last Dose  . sodium chloride 0.9 % injection 10 mL  10 mL Intracatheter PRN Forest Gleason, MD   10 mL at 04/17/15 1339    OBJECTIVE:  Filed Vitals:   05/20/15 1348  BP: 107/65  Pulse: 84  Temp: 94.2 F (34.6 C)     Body mass index is 20.27 kg/(m^2).    ECOG FS:1 - Symptomatic but completely ambulatory  PHYSICAL EXAM: GENERAL status: Performance status is good.  Patient has not lost significant weight HEENT: No evidence of stomatitis. Sclera and conjunctivae :: No jaundice.   pale looking. Lungs: Air  entry equal on both sides.  No rhonchi.  No rales.  Cardiac: Heart sounds are normal.  No pericardial rub.  No murmur. Lymphatic system: Cervical, axillary, inguinal, lymph nodes not palpable GI: Abdomen is soft.  No ascites.  Liver spleen not palpable.  No tenderness.  Bowel sounds are within normal limit Lower extremity: No edema Neurological system: Higher functions, cranial nerves intact no evidence of peripheral neuropathy. Skin: No rash.  No ecchymosis.Marland Kitchen   LAB RESULTS:  No visits with results within 2 Day(s) from this visit. Latest known visit with results is:  Appointment on 05/13/2015  Component Date Value Ref Range Status  . WBC 05/13/2015 4.9  3.6 - 11.0 K/uL Final  . RBC 05/13/2015 4.43  3.80 - 5.20 MIL/uL Final  . Hemoglobin 05/13/2015 13.7  12.0 - 16.0 g/dL Final  . HCT 05/13/2015 40.5  35.0 - 47.0 % Final  . MCV 05/13/2015 91.4  80.0 - 100.0 fL Final  . MCH  05/13/2015 30.9  26.0 - 34.0 pg Final  . MCHC 05/13/2015 33.8  32.0 - 36.0 g/dL Final  . RDW 05/13/2015 14.6* 11.5 - 14.5 % Final  . Platelets 05/13/2015 179  150 - 440 K/uL Final  . Neutrophils Relative % 05/13/2015 63   Final  . Neutro Abs 05/13/2015 3.1  1.4 - 6.5 K/uL Final  . Lymphocytes Relative 05/13/2015 24   Final  . Lymphs Abs 05/13/2015 1.2  1.0 - 3.6 K/uL Final  . Monocytes Relative 05/13/2015 8   Final  . Monocytes Absolute 05/13/2015 0.4  0.2 - 0.9 K/uL Final  . Eosinophils Relative 05/13/2015 2   Final  . Eosinophils Absolute 05/13/2015 0.1  0 - 0.7 K/uL Final  . Basophils Relative 05/13/2015 3   Final  . Basophils Absolute 05/13/2015 0.1  0 - 0.1 K/uL Final  . Sodium 05/13/2015 136  135 - 145 mmol/L Final  . Potassium 05/13/2015 3.6  3.5 - 5.1 mmol/L Final  . Chloride 05/13/2015 103  101 - 111 mmol/L Final  . CO2 05/13/2015 29  22 - 32 mmol/L Final  . Glucose, Bld 05/13/2015 92  65 - 99 mg/dL Final  . BUN 05/13/2015 23* 6 - 20 mg/dL Final  . Creatinine, Ser 05/13/2015 1.02* 0.44 - 1.00 mg/dL Final  . Calcium 05/13/2015 8.5* 8.9 - 10.3 mg/dL Final  . Total Protein 05/13/2015 6.9  6.5 - 8.1 g/dL Final  . Albumin 05/13/2015 3.7  3.5 - 5.0 g/dL Final  . AST 05/13/2015 19  15 - 41 U/L Final  . ALT 05/13/2015 12* 14 - 54 U/L Final  . Alkaline Phosphatase 05/13/2015 71  38 - 126 U/L Final  . Total Bilirubin 05/13/2015 0.6  0.3 - 1.2 mg/dL Final  . GFR calc non Af Amer 05/13/2015 51* >60 mL/min Final  . GFR calc Af Amer 05/13/2015 59* >60 mL/min Final   Comment: (NOTE) The eGFR has been calculated using the CKD EPI equation. This calculation has not been validated in all clinical situations. eGFR's persistently <60 mL/min signify possible Chronic Kidney Disease.   . Anion gap 05/13/2015 4* 5 - 15 Final  . Magnesium 05/13/2015 1.5* 1.7 - 2.4 mg/dL Final    IMPRESSION: 1. Slightly diminished aeration within the right middle lobe bronchi. The extent of right middle lobe  atelectasis however is not significantly changed. Findings are equivocal for progressive endobronchial tumor in this area. 2. Stable appearance of sub cm mediastinal lymph nodes. 3. Small pulmonary nodules are not significantly changed when compared with previous exam. 4. Aortic atherosclerosis and coronary artery calcification. 5. Emphysema.  ASSESSMENT: Small cell undifferentiated carcinoma of lung on clinical grounds there is no evidence of recurrent or progressive disease Hypomagnesemia Proceed with 2 g of IV magnesium and recheck magnesium once a month   MEDICAL DECISION MAKING:  iv   MAG .  Recheck magnesium level in now 4 weeks.  Patient was encouraged to increase oral magnesium however patient says she gets diarrhea. Repeat CT scan on May 18 2005 has been reviewed.  In compared with the patient's previous CT scan and reviewed with the patient.  There is some increase in now right lower lobe atelectasis but no significant change and will be optional.  No other enlargement of lymph node.  No other nodules or metastases. Patient was again counseled regarding smoking   Patient expressed understanding and was in agreement with this plan. She also understands that She can call clinic at any time with any questions, concerns, or complaints.    No matching staging information was found for the patient.  Forest Gleason, MD   05/20/2015 2:10 PM

## 2015-05-20 NOTE — Progress Notes (Signed)
Patient does have living will.  Currently smokes. Patient here today for CT results.

## 2015-06-17 ENCOUNTER — Inpatient Hospital Stay: Payer: Medicare Other | Attending: Oncology

## 2015-06-17 ENCOUNTER — Inpatient Hospital Stay: Payer: Medicare Other

## 2015-06-17 DIAGNOSIS — C3490 Malignant neoplasm of unspecified part of unspecified bronchus or lung: Secondary | ICD-10-CM

## 2015-06-17 DIAGNOSIS — Z79899 Other long term (current) drug therapy: Secondary | ICD-10-CM | POA: Insufficient documentation

## 2015-06-17 LAB — MAGNESIUM: Magnesium: 1.6 mg/dL — ABNORMAL LOW (ref 1.7–2.4)

## 2015-06-17 MED ORDER — HEPARIN SOD (PORK) LOCK FLUSH 100 UNIT/ML IV SOLN
500.0000 [IU] | Freq: Once | INTRAVENOUS | Status: AC
  Administered 2015-06-17: 500 [IU] via INTRAVENOUS
  Filled 2015-06-17: qty 5

## 2015-06-17 MED ORDER — MAGNESIUM SULFATE 2 GM/50ML IV SOLN
2.0000 g | Freq: Once | INTRAVENOUS | Status: AC
  Administered 2015-06-17: 2 g via INTRAVENOUS
  Filled 2015-06-17: qty 50

## 2015-06-17 MED ORDER — SODIUM CHLORIDE 0.9 % IJ SOLN
10.0000 mL | Freq: Once | INTRAMUSCULAR | Status: AC
  Administered 2015-06-17: 10 mL via INTRAVENOUS
  Filled 2015-06-17: qty 10

## 2015-06-17 MED ORDER — SODIUM CHLORIDE 0.9 % IV SOLN
INTRAVENOUS | Status: DC
  Administered 2015-06-17: 14:00:00 via INTRAVENOUS
  Filled 2015-06-17: qty 1000

## 2015-07-15 ENCOUNTER — Inpatient Hospital Stay: Payer: Medicare Other

## 2015-07-15 ENCOUNTER — Ambulatory Visit
Admission: RE | Admit: 2015-07-15 | Discharge: 2015-07-15 | Disposition: A | Discharge status: Home / Self Care | Payer: Medicare Other | Source: Ambulatory Visit | Attending: Oncology | Admitting: Oncology

## 2015-07-15 ENCOUNTER — Inpatient Hospital Stay: Payer: Medicare Other | Attending: Oncology | Admitting: Oncology

## 2015-07-15 ENCOUNTER — Encounter: Payer: Self-pay | Admitting: Oncology

## 2015-07-15 ENCOUNTER — Other Ambulatory Visit: Payer: Self-pay | Admitting: *Deleted

## 2015-07-15 VITALS — BP 109/70 | HR 105 | Temp 95.7°F | Wt 117.9 lb

## 2015-07-15 DIAGNOSIS — Z85118 Personal history of other malignant neoplasm of bronchus and lung: Secondary | ICD-10-CM | POA: Diagnosis not present

## 2015-07-15 DIAGNOSIS — J449 Chronic obstructive pulmonary disease, unspecified: Secondary | ICD-10-CM | POA: Insufficient documentation

## 2015-07-15 DIAGNOSIS — R918 Other nonspecific abnormal finding of lung field: Secondary | ICD-10-CM | POA: Insufficient documentation

## 2015-07-15 DIAGNOSIS — D751 Secondary polycythemia: Secondary | ICD-10-CM | POA: Insufficient documentation

## 2015-07-15 DIAGNOSIS — J9811 Atelectasis: Secondary | ICD-10-CM | POA: Diagnosis not present

## 2015-07-15 DIAGNOSIS — F17210 Nicotine dependence, cigarettes, uncomplicated: Secondary | ICD-10-CM | POA: Diagnosis not present

## 2015-07-15 DIAGNOSIS — Z23 Encounter for immunization: Secondary | ICD-10-CM | POA: Insufficient documentation

## 2015-07-15 DIAGNOSIS — Z79899 Other long term (current) drug therapy: Secondary | ICD-10-CM | POA: Diagnosis not present

## 2015-07-15 DIAGNOSIS — E785 Hyperlipidemia, unspecified: Secondary | ICD-10-CM | POA: Diagnosis not present

## 2015-07-15 DIAGNOSIS — C3490 Malignant neoplasm of unspecified part of unspecified bronchus or lung: Secondary | ICD-10-CM

## 2015-07-15 DIAGNOSIS — Z9221 Personal history of antineoplastic chemotherapy: Secondary | ICD-10-CM | POA: Insufficient documentation

## 2015-07-15 DIAGNOSIS — R05 Cough: Secondary | ICD-10-CM | POA: Diagnosis present

## 2015-07-15 DIAGNOSIS — Z809 Family history of malignant neoplasm, unspecified: Secondary | ICD-10-CM

## 2015-07-15 DIAGNOSIS — I2510 Atherosclerotic heart disease of native coronary artery without angina pectoris: Secondary | ICD-10-CM | POA: Diagnosis not present

## 2015-07-15 DIAGNOSIS — Z85048 Personal history of other malignant neoplasm of rectum, rectosigmoid junction, and anus: Secondary | ICD-10-CM | POA: Insufficient documentation

## 2015-07-15 DIAGNOSIS — I700 Atherosclerosis of aorta: Secondary | ICD-10-CM | POA: Diagnosis not present

## 2015-07-15 DIAGNOSIS — Z801 Family history of malignant neoplasm of trachea, bronchus and lung: Secondary | ICD-10-CM | POA: Insufficient documentation

## 2015-07-15 DIAGNOSIS — Z923 Personal history of irradiation: Secondary | ICD-10-CM | POA: Diagnosis not present

## 2015-07-15 LAB — CBC WITH DIFFERENTIAL/PLATELET
BASOS PCT: 1 %
Basophils Absolute: 0.1 10*3/uL (ref 0–0.1)
Eosinophils Absolute: 0.1 10*3/uL (ref 0–0.7)
Eosinophils Relative: 1 %
HEMATOCRIT: 39.8 % (ref 35.0–47.0)
Hemoglobin: 13.3 g/dL (ref 12.0–16.0)
LYMPHS PCT: 11 %
Lymphs Abs: 1.1 10*3/uL (ref 1.0–3.6)
MCH: 30.3 pg (ref 26.0–34.0)
MCHC: 33.3 g/dL (ref 32.0–36.0)
MCV: 91 fL (ref 80.0–100.0)
MONO ABS: 0.7 10*3/uL (ref 0.2–0.9)
MONOS PCT: 7 %
NEUTROS PCT: 80 %
Neutro Abs: 7.7 10*3/uL — ABNORMAL HIGH (ref 1.4–6.5)
Platelets: 232 10*3/uL (ref 150–440)
RBC: 4.37 MIL/uL (ref 3.80–5.20)
RDW: 14.3 % (ref 11.5–14.5)
WBC: 9.6 10*3/uL (ref 3.6–11.0)

## 2015-07-15 LAB — COMPREHENSIVE METABOLIC PANEL
ALT: 15 U/L (ref 14–54)
AST: 20 U/L (ref 15–41)
Albumin: 3.1 g/dL — ABNORMAL LOW (ref 3.5–5.0)
Alkaline Phosphatase: 119 U/L (ref 38–126)
Anion gap: 8 (ref 5–15)
BUN: 28 mg/dL — AB (ref 6–20)
CHLORIDE: 102 mmol/L (ref 101–111)
CO2: 30 mmol/L (ref 22–32)
CREATININE: 1.02 mg/dL — AB (ref 0.44–1.00)
Calcium: 9.4 mg/dL (ref 8.9–10.3)
GFR calc non Af Amer: 51 mL/min — ABNORMAL LOW (ref >60)
GFR, EST AFRICAN AMERICAN: 59 mL/min — AB (ref >60)
Glucose, Bld: 101 mg/dL — ABNORMAL HIGH (ref 65–99)
Potassium: 3.7 mmol/L (ref 3.5–5.1)
SODIUM: 140 mmol/L (ref 135–145)
Total Bilirubin: 0.5 mg/dL (ref 0.3–1.2)
Total Protein: 7.4 g/dL (ref 6.5–8.1)

## 2015-07-15 LAB — MAGNESIUM: Magnesium: 1.5 mg/dL — ABNORMAL LOW (ref 1.7–2.4)

## 2015-07-15 MED ORDER — INFLUENZA VAC SPLIT QUAD 0.5 ML IM SUSY
0.5000 mL | PREFILLED_SYRINGE | Freq: Once | INTRAMUSCULAR | Status: DC
Start: 2015-07-15 — End: 2015-07-15

## 2015-07-15 MED ORDER — MAGNESIUM SULFATE 2 GM/50ML IV SOLN
2.0000 g | Freq: Once | INTRAVENOUS | Status: AC
  Administered 2015-07-15: 2 g via INTRAVENOUS
  Filled 2015-07-15: qty 50

## 2015-07-15 MED ORDER — INFLUENZA VAC SPLIT QUAD 0.5 ML IM SUSY
0.5000 mL | PREFILLED_SYRINGE | Freq: Once | INTRAMUSCULAR | Status: AC
  Administered 2015-07-15: 0.5 mL via INTRAMUSCULAR
  Filled 2015-07-15: qty 0.5

## 2015-07-15 NOTE — Progress Notes (Signed)
Patient acute add on today for pain in her left lung area.  Patient had CXR prior to appointment. Also complains of cough.

## 2015-07-16 ENCOUNTER — Encounter: Payer: Self-pay | Admitting: Oncology

## 2015-07-16 ENCOUNTER — Other Ambulatory Visit: Payer: Self-pay | Admitting: *Deleted

## 2015-07-16 ENCOUNTER — Telehealth: Payer: Self-pay | Admitting: Oncology

## 2015-07-16 DIAGNOSIS — C3490 Malignant neoplasm of unspecified part of unspecified bronchus or lung: Secondary | ICD-10-CM

## 2015-07-16 NOTE — Telephone Encounter (Signed)
-----   Message from Forest Gleason, MD sent at 07/16/2015  8:01 AM EST ----- Regarding: CT scan This patient needs CT scan of chest with contrast in December 2 or third week and see me back after that or if patient has an appointment CT scan of chest with contrast restaging of lung cancer prior to next appointment

## 2015-07-16 NOTE — Progress Notes (Signed)
[facility] [phone]) [phone]  Fax:(336) [place]: [birth year]  MR#: [id number]  LSL#:[id number]  Patient Care Team: Vivien Rota, MD as PCP - General (Internal Medicine)  CHIEF COMPLAINT:  Chief Complaint  Patient presents with  . 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 T1, N2, M0 tumor stage III Diagnoses by bronchoscopy in June of 2015 2.  Chronic tobacco abuse and long-term COPD 3.started chemotherapy with cis-platinum VP-16 June of 2015 . with concurrent radiation treatment 4.last chemotherapy was on September 16,2015   finished radiation therapy by end of September 2015.Marland Kitchen Prophylactic brain radiation has been discontinued because of progressive weakness in both lower extremity (November, 2015)     SCC of lung (small cell carcinoma) ([place])    81 year old lady with a history of small cell undifferentiated carcinoma of lung status post chemoradiation therapy INTERVAL HISTORY: Jacqueline Zamora came today for further follow-up.  Since last evaluation she has been doing fairly good.  Continues to lose weight.  Appetite has improved.  No nausea.  No vomiting.  No cough.  Patient continues to smoke. Patient came as an add acute add-on complaining of pain in the left lower rib cage area.  According to her pain started few days ago.  Patient is taking Tylenol which is helping.  Pain is not related to coughing.  Or taking deep breath.  Patient does not remember any injury. She also has hypomagnesemia and requires magnesium infusion time to time Had a chest x-ray done today.  No hemoptysis. REVIEW OF SYSTEMS:    general status:   No change in a performance status.  No chills.  No fever. HEENT: Alopecia.  No evidence of stomatitis Lungs: No cough or shortness of breath. Lower chest wall area pain is described about Continues to smoke Cardiac: No chest pain or paroxysmal nocturnal dyspnea GI: No nausea  no vomiting no diarrhea no abdominal pain Skin: No rash Lower extremity no swelling Neurological system: No tingling.  No numbness.  No other focal signs Musculoskeletal system no bony pains Has not lost any weight.  Appetite has been stable. As per HPI. Otherwise, a complete review of systems is negatve.   Significant History/PMH:   post menopausal:    polycythemia:    hypercalcemia:    lung cancer:    Pulmonary nodule:    Hyperlipidemia:    Colon or Rectal Cancer:    COPD:    Asthma:    Tonsillectomy:    Cataract Extraction:    Colon Resection:   Smoking History: Smoking History 0.5 Packs per day and has received prescription for chantix. Smoking Cessation Information Given to Patient .  PFSH: Comments: h/o of lung cancer or melanoma and thyroid cancer in the family  Social History: negative alcohol  Additional Past Medical and Surgical History: no significant history of any other medical illness  In 2004 patient had carcinoma of colon underwent surgery and no followup therapy    ADVANCED DIRECTIVES:  Patient does have advance healthcare directive, Patient   does not desire to make any changes  HEALTH MAINTENANCE: Social History  Substance Use Topics  . Smoking status: Current Every Day Smoker -- 1.00 packs/day for 62 years    Types: Cigarettes  . Smokeless tobacco: Never Used  . Alcohol Use: No      Allergies  Allergen Reactions  . Neomycin-Bacitracin Zn-Polymyx Rash  . Other Rash  Iodine Cleansing Solution and Dye  . Penicillins Rash    Current Outpatient Prescriptions  Medication Sig Dispense Refill  . ALPRAZolam (XANAX) 0.25 MG tablet     . azelastine (ASTELIN) 0.1 % nasal spray Place into the nose.    . Butenafine HCl 1 % cream Apply topically.    . Cholecalciferol (D 2000) 2000 UNITS TABS Take by mouth.    . Cyanocobalamin (RA VITAMIN B-12 TR) 1000 MCG TBCR Take by mouth.    . diltiazem (DILTIAZEM CD) 120 MG 24 hr capsule Take 120 mg  by mouth daily.    . fluticasone (FLONASE) 50 MCG/ACT nasal spray Place into the nose.    Marland Kitchen Fluticasone-Salmeterol (ADVAIR) 500-50 MCG/DOSE AEPB Inhale into the lungs.    Marland Kitchen ipratropium-albuterol (DUONEB) 0.5-2.5 (3) MG/3ML SOLN     . ADVAIR DISKUS 250-50 MCG/DOSE AEPB     . albuterol (PROAIR HFA) 108 (90 BASE) MCG/ACT inhaler Inhale into the lungs.     No current facility-administered medications for this visit.   Facility-Administered Medications Ordered in Other Visits  Medication Dose Route Frequency Provider Last Rate Last Dose  . sodium chloride 0.9 % injection 10 mL  10 mL Intracatheter PRN Jacqueline Gleason, MD   10 mL at 04/17/15 1339    OBJECTIVE:  Filed Vitals:   07/15/15 1448  BP: 109/70  Pulse: 105  Temp: 95.7 F (35.4 C)     Body mass index is 20.24 kg/(m^2).    ECOG FS:1 - Symptomatic but completely ambulatory  PHYSICAL EXAM: GENERAL status: Performance status is good.  Patient has not lost significant weight HEENT: No evidence of stomatitis. Sclera and conjunctivae :: No jaundice.   pale looking. Lungs: Air  entry equal on both sides.  No rhonchi.  No rales.  There is tenderness in the left lower rib cage area Emphysematous chest Cardiac: Heart sounds are normal.  No pericardial rub.  No murmur. Lymphatic system: Cervical, axillary, inguinal, lymph nodes not palpable GI: Abdomen is soft.  No ascites.  Liver spleen not palpable.  No tenderness.  Bowel sounds are within normal limit Lower extremity: No edema Neurological system: Higher functions, cranial nerves intact no evidence of peripheral neuropathy. Skin: No rash.  No ecchymosis.Marland Kitchen   LAB RESULTS:  Appointment on 07/15/2015  Component Date Value Ref Range Status  . Magnesium 07/15/2015 1.5* 1.7 - 2.4 mg/dL Final  . Sodium 07/15/2015 140  135 - 145 mmol/L Final  . Potassium 07/15/2015 3.7  3.5 - 5.1 mmol/L Final  . Chloride 07/15/2015 102  101 - 111 mmol/L Final  . CO2 07/15/2015 30  22 - 32 mmol/L Final  .  Glucose, Bld 07/15/2015 101* 65 - 99 mg/dL Final  . BUN 07/15/2015 28* 6 - 20 mg/dL Final  . Creatinine, Ser 07/15/2015 1.02* 0.44 - 1.00 mg/dL Final  . Calcium 07/15/2015 9.4  8.9 - 10.3 mg/dL Final  . Total Protein 07/15/2015 7.4  6.5 - 8.1 g/dL Final  . Albumin 07/15/2015 3.1* 3.5 - 5.0 g/dL Final  . AST 07/15/2015 20  15 - 41 U/L Final  . ALT 07/15/2015 15  14 - 54 U/L Final  . Alkaline Phosphatase 07/15/2015 119  38 - 126 U/L Final  . Total Bilirubin 07/15/2015 0.5  0.3 - 1.2 mg/dL Final  . GFR calc non Af Amer 07/15/2015 51* >60 mL/min Final  . GFR calc Af Amer 07/15/2015 59* >60 mL/min Final   Comment: (NOTE) The eGFR has been calculated using the CKD EPI  equation. This calculation has not been validated in all clinical situations. eGFR's persistently <60 mL/min signify possible Chronic Kidney Disease.   . Anion gap 07/15/2015 8  5 - 15 Final  . WBC 07/15/2015 9.6  3.6 - 11.0 K/uL Final  . RBC 07/15/2015 4.37  3.80 - 5.20 MIL/uL Final  . Hemoglobin 07/15/2015 13.3  12.0 - 16.0 g/dL Final  . HCT 07/15/2015 39.8  35.0 - 47.0 % Final  . MCV 07/15/2015 91.0  80.0 - 100.0 fL Final  . MCH 07/15/2015 30.3  26.0 - 34.0 pg Final  . MCHC 07/15/2015 33.3  32.0 - 36.0 g/dL Final  . RDW 07/15/2015 14.3  11.5 - 14.5 % Final  . Platelets 07/15/2015 232  150 - 440 K/uL Final  . Neutrophils Relative % 07/15/2015 80   Final  . Neutro Abs 07/15/2015 7.7* 1.4 - 6.5 K/uL Final  . Lymphocytes Relative 07/15/2015 11   Final  . Lymphs Abs 07/15/2015 1.1  1.0 - 3.6 K/uL Final  . Monocytes Relative 07/15/2015 7   Final  . Monocytes Absolute 07/15/2015 0.7  0.2 - 0.9 K/uL Final  . Eosinophils Relative 07/15/2015 1   Final  . Eosinophils Absolute 07/15/2015 0.1  0 - 0.7 K/uL Final  . Basophils Relative 07/15/2015 1   Final  . Basophils Absolute 07/15/2015 0.1  0 - 0.1 K/uL Final    IMPRESSION: 1. Slightly diminished aeration within the right middle lobe bronchi. The extent of right middle lobe  atelectasis however is not significantly changed. Findings are equivocal for progressive endobronchial tumor in this area. 2. Stable appearance of sub cm mediastinal lymph nodes. 3. Small pulmonary nodules are not significantly changed when compared with previous exam. 4. Aortic atherosclerosis and coronary artery calcification. 5. Emphysema.  ASSESSMENT: Small cell undifferentiated carcinoma of lung on clinical grounds there is no evidence of recurrent or progressive disease Hypomagnesemia Proceed with 2 g of IV magnesium and recheck magnesium once a month.  Chest x-ray has been reviewed independently.  The right hilar area perihilar infiltrate is increased. Exact etiology of pain in the left lower rib cage area is not known possibility of CT scan or a PET scan can be considered for follow-up hip pain persist   MEDICAL DECISION MAKING:  Review of chest x-ray Infuse 2 g of magnesium Consider CT scan of the chest to evaluate increasing perihilar right hilar infiltration  Patient expressed understanding and was in agreement with this plan. She also understands that She can call clinic at any time with any questions, concerns, or complaints.    No matching staging information was found for the patient.  Jacqueline Gleason, MD   07/16/2015 7:56 AM

## 2015-08-14 ENCOUNTER — Ambulatory Visit
Admission: RE | Admit: 2015-08-14 | Discharge: 2015-08-14 | Disposition: A | Discharge status: Home / Self Care | Payer: Medicare Other | Source: Ambulatory Visit | Attending: Oncology | Admitting: Oncology

## 2015-08-14 DIAGNOSIS — I7090 Unspecified atherosclerosis: Secondary | ICD-10-CM | POA: Insufficient documentation

## 2015-08-14 DIAGNOSIS — J439 Emphysema, unspecified: Secondary | ICD-10-CM | POA: Diagnosis not present

## 2015-08-14 DIAGNOSIS — I2510 Atherosclerotic heart disease of native coronary artery without angina pectoris: Secondary | ICD-10-CM | POA: Diagnosis not present

## 2015-08-14 DIAGNOSIS — C3490 Malignant neoplasm of unspecified part of unspecified bronchus or lung: Secondary | ICD-10-CM | POA: Insufficient documentation

## 2015-08-14 MED ORDER — IOHEXOL 300 MG/ML  SOLN
75.0000 mL | Freq: Once | INTRAMUSCULAR | Status: AC | PRN
  Administered 2015-08-14: 100 mL via INTRAVENOUS

## 2015-08-15 ENCOUNTER — Ambulatory Visit: Admission: RE | Admit: 2015-08-15 | Payer: Medicare Other | Source: Ambulatory Visit

## 2015-08-18 ENCOUNTER — Inpatient Hospital Stay: Payer: Medicare Other

## 2015-08-18 ENCOUNTER — Inpatient Hospital Stay: Payer: Medicare Other | Attending: Oncology

## 2015-08-18 ENCOUNTER — Inpatient Hospital Stay (HOSPITAL_BASED_OUTPATIENT_CLINIC_OR_DEPARTMENT_OTHER): Payer: Medicare Other | Admitting: Oncology

## 2015-08-18 ENCOUNTER — Encounter: Payer: Self-pay | Admitting: Oncology

## 2015-08-18 VITALS — BP 145/88 | HR 117 | Temp 98.1°F | Resp 18 | Wt 113.1 lb

## 2015-08-18 DIAGNOSIS — C3490 Malignant neoplasm of unspecified part of unspecified bronchus or lung: Secondary | ICD-10-CM

## 2015-08-18 DIAGNOSIS — J449 Chronic obstructive pulmonary disease, unspecified: Secondary | ICD-10-CM | POA: Diagnosis not present

## 2015-08-18 DIAGNOSIS — R0602 Shortness of breath: Secondary | ICD-10-CM

## 2015-08-18 DIAGNOSIS — D751 Secondary polycythemia: Secondary | ICD-10-CM | POA: Insufficient documentation

## 2015-08-18 DIAGNOSIS — R1013 Epigastric pain: Secondary | ICD-10-CM

## 2015-08-18 DIAGNOSIS — C3491 Malignant neoplasm of unspecified part of right bronchus or lung: Secondary | ICD-10-CM | POA: Insufficient documentation

## 2015-08-18 DIAGNOSIS — Z9221 Personal history of antineoplastic chemotherapy: Secondary | ICD-10-CM | POA: Insufficient documentation

## 2015-08-18 DIAGNOSIS — J45909 Unspecified asthma, uncomplicated: Secondary | ICD-10-CM

## 2015-08-18 DIAGNOSIS — F0390 Unspecified dementia without behavioral disturbance: Secondary | ICD-10-CM | POA: Insufficient documentation

## 2015-08-18 DIAGNOSIS — R634 Abnormal weight loss: Secondary | ICD-10-CM | POA: Diagnosis not present

## 2015-08-18 DIAGNOSIS — I2510 Atherosclerotic heart disease of native coronary artery without angina pectoris: Secondary | ICD-10-CM

## 2015-08-18 DIAGNOSIS — Z780 Asymptomatic menopausal state: Secondary | ICD-10-CM | POA: Insufficient documentation

## 2015-08-18 DIAGNOSIS — E785 Hyperlipidemia, unspecified: Secondary | ICD-10-CM | POA: Insufficient documentation

## 2015-08-18 DIAGNOSIS — Z923 Personal history of irradiation: Secondary | ICD-10-CM | POA: Diagnosis not present

## 2015-08-18 DIAGNOSIS — R413 Other amnesia: Secondary | ICD-10-CM | POA: Diagnosis not present

## 2015-08-18 DIAGNOSIS — F17210 Nicotine dependence, cigarettes, uncomplicated: Secondary | ICD-10-CM

## 2015-08-18 DIAGNOSIS — Z79899 Other long term (current) drug therapy: Secondary | ICD-10-CM | POA: Diagnosis not present

## 2015-08-18 DIAGNOSIS — R05 Cough: Secondary | ICD-10-CM | POA: Diagnosis not present

## 2015-08-18 DIAGNOSIS — Z85038 Personal history of other malignant neoplasm of large intestine: Secondary | ICD-10-CM

## 2015-08-18 DIAGNOSIS — R410 Disorientation, unspecified: Secondary | ICD-10-CM

## 2015-08-18 LAB — CBC WITH DIFFERENTIAL/PLATELET
Basophils Absolute: 0 10*3/uL (ref 0–0.1)
Basophils Relative: 0 %
EOS ABS: 0.1 10*3/uL (ref 0–0.7)
EOS PCT: 2 %
HCT: 41.4 % (ref 35.0–47.0)
Hemoglobin: 13.9 g/dL (ref 12.0–16.0)
LYMPHS ABS: 1.3 10*3/uL (ref 1.0–3.6)
Lymphocytes Relative: 19 %
MCH: 30.4 pg (ref 26.0–34.0)
MCHC: 33.5 g/dL (ref 32.0–36.0)
MCV: 90.8 fL (ref 80.0–100.0)
MONOS PCT: 7 %
Monocytes Absolute: 0.5 10*3/uL (ref 0.2–0.9)
Neutro Abs: 4.8 10*3/uL (ref 1.4–6.5)
Neutrophils Relative %: 72 %
PLATELETS: 258 10*3/uL (ref 150–440)
RBC: 4.56 MIL/uL (ref 3.80–5.20)
RDW: 16 % — ABNORMAL HIGH (ref 11.5–14.5)
WBC: 6.6 10*3/uL (ref 3.6–11.0)

## 2015-08-18 LAB — COMPREHENSIVE METABOLIC PANEL
ALT: 11 U/L — ABNORMAL LOW (ref 14–54)
ANION GAP: 8 (ref 5–15)
AST: 17 U/L (ref 15–41)
Albumin: 3.5 g/dL (ref 3.5–5.0)
Alkaline Phosphatase: 92 U/L (ref 38–126)
BUN: 21 mg/dL — ABNORMAL HIGH (ref 6–20)
CALCIUM: 8.9 mg/dL (ref 8.9–10.3)
CHLORIDE: 99 mmol/L — AB (ref 101–111)
CO2: 28 mmol/L (ref 22–32)
Creatinine, Ser: 0.99 mg/dL (ref 0.44–1.00)
GFR calc non Af Amer: 52 mL/min — ABNORMAL LOW (ref >60)
Glucose, Bld: 115 mg/dL — ABNORMAL HIGH (ref 65–99)
Potassium: 3.7 mmol/L (ref 3.5–5.1)
SODIUM: 135 mmol/L (ref 135–145)
Total Bilirubin: 0.5 mg/dL (ref 0.3–1.2)
Total Protein: 7 g/dL (ref 6.5–8.1)

## 2015-08-18 LAB — MAGNESIUM: Magnesium: 1.5 mg/dL — ABNORMAL LOW (ref 1.7–2.4)

## 2015-08-18 MED ORDER — HEPARIN SOD (PORK) LOCK FLUSH 100 UNIT/ML IV SOLN
500.0000 [IU] | Freq: Once | INTRAVENOUS | Status: AC
  Administered 2015-08-18: 500 [IU] via INTRAVENOUS
  Filled 2015-08-18: qty 5

## 2015-08-18 MED ORDER — MAGNESIUM SULFATE 2 GM/50ML IV SOLN
2.0000 g | Freq: Once | INTRAVENOUS | Status: AC
  Administered 2015-08-18: 2 g via INTRAVENOUS
  Filled 2015-08-18: qty 50

## 2015-08-18 MED ORDER — SODIUM CHLORIDE 0.9 % IV SOLN
INTRAVENOUS | Status: DC
  Administered 2015-08-18: 16:00:00 via INTRAVENOUS
  Filled 2015-08-18: qty 1000

## 2015-08-18 MED ORDER — OMEPRAZOLE 20 MG PO CPDR: 20.0000 mg | DELAYED_RELEASE_CAPSULE | Freq: Two times a day (BID) | ORAL | Status: AC

## 2015-08-18 MED ORDER — SODIUM CHLORIDE 0.9 % IJ SOLN
10.0000 mL | INTRAMUSCULAR | Status: DC | PRN
  Filled 2015-08-18: qty 10

## 2015-08-18 NOTE — Progress Notes (Signed)
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  Chronic tobacco abuse and long-term COPD 3.started chemotherapy with cis-platinum VP-16 June of 2015 . with concurrent radiation treatment 4.last chemotherapy was on September 16,2015   finished radiation therapy by end of September 2015.Marland Kitchen Prophylactic brain radiation has been discontinued because of progressive weakness in both lower extremity (November, 2015)    2.  CT scan of December, 2016 shows progressive disease.   INTERVAL HISTORY: Mrs. Buren Kos  condition has been slowly declining.  Increasing cough and shortness of breath Family also reports patient is very forgetful.  Family is also noticed that patient neighbor complains about her forgetting number of things.  Losing weight.  Appetite is stable. Patient continues to have hypomagnesemia   no hemoptysis.  Had a repeat CT scan which has been evaluated independently reviewed and reviewed with the patient and family Patient also complains of pain in epigastric area  REVIEW OF SYSTEMS:    general status:    declining performance statusNo chills.  No fever. HEENT: Alopecia.  No evidence of stomatitis Lungs: No cough or shortness of breath. Lower chest wall area pain is described about Continues to smoke Cardiac: No chest pain or paroxysmal nocturnal dyspnea GI:  anal in epigastric area constant dull achingin: No rash Lower extremity no swelling Neurological system: No tingling.  No numbness.  No other focal signs Musculoskeletal system no bony pains Has not lost any weight.  Appetite has been stable. As per HPI. Otherwise, a complete review of  systems is negatve.   Significant History/PMH:   post menopausal:    polycythemia:    hypercalcemia:    lung cancer:    Pulmonary nodule:    Hyperlipidemia:    Colon or Rectal Cancer:    COPD:    Asthma:    Tonsillectomy:    Cataract Extraction:    Colon Resection:   Smoking History: Smoking History 0.5 Packs per day and has received prescription for chantix. Smoking Cessation Information Given to Patient .  PFSH: Comments: h/o of lung cancer or melanoma and thyroid cancer in the family  Social History: negative alcohol  Additional Past Medical and Surgical History: no significant history of any other medical illness  In 2004 patient had carcinoma of colon underwent surgery and no followup therapy    ADVANCED DIRECTIVES:  Patient does have advance healthcare directive, Patient   does not desire to make any changes  HEALTH MAINTENANCE: Social History  Substance Use Topics  . Smoking status: Current Every Day Smoker -- 1.00 packs/day for 62 years    Types: Cigarettes  . Smokeless tobacco: Never Used  . Alcohol Use: No      Allergies  Allergen Reactions  . Neomycin-Bacitracin Zn-Polymyx Rash  . Other Rash    Iodine Cleansing Solution and Dye  . Penicillins Rash    Current Outpatient Prescriptions  Medication Sig Dispense Refill  . ADVAIR DISKUS 250-50 MCG/DOSE AEPB     . ALPRAZolam (XANAX) 0.25 MG tablet     . Butenafine HCl 1 % cream Apply topically.    . Cholecalciferol (D 2000) 2000 UNITS TABS Take by mouth.    Marland Kitchen  Cyanocobalamin (RA VITAMIN B-12 TR) 1000 MCG TBCR Take by mouth.    . diltiazem (DILTIAZEM CD) 120 MG 24 hr capsule Take 120 mg by mouth daily.    . fluticasone (FLONASE) 50 MCG/ACT nasal spray Place into the nose.    Marland Kitchen Fluticasone-Salmeterol (ADVAIR) 500-50 MCG/DOSE AEPB Inhale into the lungs.    Marland Kitchen ipratropium-albuterol (DUONEB) 0.5-2.5 (3) MG/3ML SOLN     . albuterol (PROAIR HFA) 108 (90 BASE) MCG/ACT inhaler Inhale into the lungs.      Marland Kitchen azelastine (ASTELIN) 0.1 % nasal spray Place into the nose.     No current facility-administered medications for this visit.   Facility-Administered Medications Ordered in Other Visits  Medication Dose Route Frequency Provider Last Rate Last Dose  . heparin lock flush 100 unit/mL  500 Units Intravenous Once Forest Gleason, MD      . sodium chloride 0.9 % injection 10 mL  10 mL Intracatheter PRN Forest Gleason, MD   10 mL at 04/17/15 1339  . sodium chloride 0.9 % injection 10 mL  10 mL Intravenous PRN Forest Gleason, MD        OBJECTIVE:  Filed Vitals:   08/18/15 1500  BP: 145/88  Pulse: 117  Temp: 98.1 F (36.7 C)  Resp: 18     Body mass index is 19.4 kg/(m^2).    ECOG FS:1 - Symptomatic but completely ambulatory  PHYSICAL EXAM: GENERAL status: Performance status is good.  Patient has not lost significant weight HEENT: No evidence of stomatitis. Sclera and conjunctivae :: No jaundice.   pale looking. Lungs: Air  entry equal on both sides.  No rhonchi.  No rales.  There is tenderness in the left lower rib cage area Emphysematous chest Cardiac: Heart sounds are normal.  No pericardial rub.  No murmur. Lymphatic system: Cervical, axillary, inguinal, lymph nodes not palpable GI: Abdomen is soft.  No ascites.  Liver spleen not palpable.  No tenderness.  Bowel sounds are within normal limit Lower extremity: No edema Neurological system: Higher functions, cranial nerves intact no evidence of peripheral neuropathy. Skin: No rash.  No ecchymosis.Marland Kitchen   LAB RESULTS:  Infusion on 08/18/2015  Component Date Value Ref Range Status  . WBC 08/18/2015 6.6  3.6 - 11.0 K/uL Final  . RBC 08/18/2015 4.56  3.80 - 5.20 MIL/uL Final  . Hemoglobin 08/18/2015 13.9  12.0 - 16.0 g/dL Final  . HCT 08/18/2015 41.4  35.0 - 47.0 % Final  . MCV 08/18/2015 90.8  80.0 - 100.0 fL Final  . MCH 08/18/2015 30.4  26.0 - 34.0 pg Final  . MCHC 08/18/2015 33.5  32.0 - 36.0 g/dL Final  . RDW 08/18/2015 16.0* 11.5 -  14.5 % Final  . Platelets 08/18/2015 258  150 - 440 K/uL Final  . Neutrophils Relative % 08/18/2015 72   Final  . Neutro Abs 08/18/2015 4.8  1.4 - 6.5 K/uL Final  . Lymphocytes Relative 08/18/2015 19   Final  . Lymphs Abs 08/18/2015 1.3  1.0 - 3.6 K/uL Final  . Monocytes Relative 08/18/2015 7   Final  . Monocytes Absolute 08/18/2015 0.5  0.2 - 0.9 K/uL Final  . Eosinophils Relative 08/18/2015 2   Final  . Eosinophils Absolute 08/18/2015 0.1  0 - 0.7 K/uL Final  . Basophils Relative 08/18/2015 0   Final  . Basophils Absolute 08/18/2015 0.0  0 - 0.1 K/uL Final  . Sodium 08/18/2015 135  135 - 145 mmol/L Final  . Potassium 08/18/2015 3.7  3.5 -  5.1 mmol/L Final  . Chloride 08/18/2015 99* 101 - 111 mmol/L Final  . CO2 08/18/2015 28  22 - 32 mmol/L Final  . Glucose, Bld 08/18/2015 115* 65 - 99 mg/dL Final  . BUN 08/18/2015 21* 6 - 20 mg/dL Final  . Creatinine, Ser 08/18/2015 0.99  0.44 - 1.00 mg/dL Final  . Calcium 08/18/2015 8.9  8.9 - 10.3 mg/dL Final  . Total Protein 08/18/2015 7.0  6.5 - 8.1 g/dL Final  . Albumin 08/18/2015 3.5  3.5 - 5.0 g/dL Final  . AST 08/18/2015 17  15 - 41 U/L Final  . ALT 08/18/2015 11* 14 - 54 U/L Final  . Alkaline Phosphatase 08/18/2015 92  38 - 126 U/L Final  . Total Bilirubin 08/18/2015 0.5  0.3 - 1.2 mg/dL Final  . GFR calc non Af Amer 08/18/2015 52* >60 mL/min Final  . GFR calc Af Amer 08/18/2015 >60  >60 mL/min Final   Comment: (NOTE) The eGFR has been calculated using the CKD EPI equation. This calculation has not been validated in all clinical situations. eGFR's persistently <60 mL/min signify possible Chronic Kidney Disease.   . Anion gap 08/18/2015 8  5 - 15 Final  . Magnesium 08/18/2015 1.5* 1.7 - 2.4 mg/dL Final   Oncology History       IMPRESSION: 1. Findings, as above, concerning for local recurrence of disease in the medial segment of the right middle lobe where there is an enlarging 3.1 x 2.5 x 2.2 cm mass like opacity. Correlation  with PET-CT is strongly recommended at this time. 2. Atherosclerosis, including 3 vessel coronary artery disease. 3. Mild diffuse bronchial thickening with mild centrilobular and paraseptal emphysema; imaging findings suggestive of underlying COPD. 4. Additional incidental findings, as above. These results will be called to the ordering clinician or representative by the Radiologist Assistant, and communication documented in the PACS or zVision Dashboard.   ASSESSMENT: Small cell undifferentiated carcinoma of lung  CT scan has been reviewed independently and patient's shows progressive disease.   PET scan has been ordered for complete restaging  2 epigastric discomfort exact etiology is not clear will start Prilosec 20 mg by mouth twice a day and wait  For PET scan result to see if there is any bone metastases or a liver metastases causing this pain .  3.progressive dementia  Delayed effect of radiation therapy to the brain versus metastases   MRI scan or a CT scan of brain has been planned    Hypomagnesemia Proceed with 2 g of IV magnesium and recheck magnesium once a month..      MEDICAL DECISION MAKING:Review of CT scan Ordering PET scan  IV 2 g of magnesium.  Ordering MRI scan of brain  Total duration of visit was 45 minutes.  50% or more time was spent in counseling patient and family regarding prognosis . And social issue of managing care of the patient. It was decided that patient will move to her daughter's home to ensure her safety while workup is in progress   Patient expressed understanding and was in agreement with this plan. She also understands that She can call clinic at any time with any questions, concerns, or complaints.    No matching staging information was found for the patient.  Forest Gleason, MD   08/18/2015 3:13 PM

## 2015-08-19 ENCOUNTER — Encounter: Payer: Self-pay | Admitting: Oncology

## 2015-09-09 ENCOUNTER — Ambulatory Visit: Payer: Medicare Other

## 2015-09-09 ENCOUNTER — Ambulatory Visit: Admission: RE | Admit: 2015-09-09 | Payer: Medicare Other | Source: Ambulatory Visit

## 2015-09-11 ENCOUNTER — Inpatient Hospital Stay: Payer: Medicare Other

## 2015-09-11 ENCOUNTER — Inpatient Hospital Stay: Payer: Medicare Other | Admitting: Oncology

## 2015-09-12 ENCOUNTER — Ambulatory Visit
Admission: RE | Admit: 2015-09-12 | Discharge: 2015-09-12 | Disposition: A | Discharge status: Home / Self Care | Payer: Medicare Other | Source: Ambulatory Visit | Attending: Oncology | Admitting: Oncology

## 2015-09-12 DIAGNOSIS — C3490 Malignant neoplasm of unspecified part of unspecified bronchus or lung: Secondary | ICD-10-CM | POA: Insufficient documentation

## 2015-09-12 DIAGNOSIS — Z0189 Encounter for other specified special examinations: Secondary | ICD-10-CM | POA: Insufficient documentation

## 2015-09-12 DIAGNOSIS — Z9221 Personal history of antineoplastic chemotherapy: Secondary | ICD-10-CM | POA: Insufficient documentation

## 2015-09-12 DIAGNOSIS — R410 Disorientation, unspecified: Secondary | ICD-10-CM

## 2015-09-12 DIAGNOSIS — R9082 White matter disease, unspecified: Secondary | ICD-10-CM | POA: Diagnosis not present

## 2015-09-12 DIAGNOSIS — Z923 Personal history of irradiation: Secondary | ICD-10-CM | POA: Diagnosis not present

## 2015-09-12 DIAGNOSIS — M898X8 Other specified disorders of bone, other site: Secondary | ICD-10-CM | POA: Diagnosis not present

## 2015-09-12 DIAGNOSIS — G319 Degenerative disease of nervous system, unspecified: Secondary | ICD-10-CM | POA: Insufficient documentation

## 2015-09-12 LAB — GLUCOSE, CAPILLARY: GLUCOSE-CAPILLARY: 73 mg/dL (ref 65–99)

## 2015-09-12 MED ORDER — GADOBENATE DIMEGLUMINE 529 MG/ML IV SOLN
10.0000 mL | Freq: Once | INTRAVENOUS | Status: AC | PRN
  Administered 2015-09-12: 10 mL via INTRAVENOUS

## 2015-09-12 MED ORDER — FLUDEOXYGLUCOSE F - 18 (FDG) INJECTION: 12.6300 | Freq: Once | INTRAVENOUS | Status: DC | PRN

## 2015-09-15 ENCOUNTER — Inpatient Hospital Stay: Payer: Medicare Other

## 2015-09-15 ENCOUNTER — Encounter: Payer: Self-pay | Admitting: Oncology

## 2015-09-15 ENCOUNTER — Other Ambulatory Visit: Payer: Self-pay | Admitting: *Deleted

## 2015-09-15 ENCOUNTER — Inpatient Hospital Stay: Payer: Medicare Other | Attending: Oncology | Admitting: Oncology

## 2015-09-15 VITALS — BP 124/83 | HR 118 | Temp 96.0°F | Wt 114.4 lb

## 2015-09-15 DIAGNOSIS — F0390 Unspecified dementia without behavioral disturbance: Secondary | ICD-10-CM | POA: Insufficient documentation

## 2015-09-15 DIAGNOSIS — J449 Chronic obstructive pulmonary disease, unspecified: Secondary | ICD-10-CM | POA: Diagnosis not present

## 2015-09-15 DIAGNOSIS — F17210 Nicotine dependence, cigarettes, uncomplicated: Secondary | ICD-10-CM | POA: Insufficient documentation

## 2015-09-15 DIAGNOSIS — M898X9 Other specified disorders of bone, unspecified site: Secondary | ICD-10-CM | POA: Diagnosis not present

## 2015-09-15 DIAGNOSIS — I2510 Atherosclerotic heart disease of native coronary artery without angina pectoris: Secondary | ICD-10-CM | POA: Diagnosis not present

## 2015-09-15 DIAGNOSIS — Z9221 Personal history of antineoplastic chemotherapy: Secondary | ICD-10-CM | POA: Insufficient documentation

## 2015-09-15 DIAGNOSIS — C3490 Malignant neoplasm of unspecified part of unspecified bronchus or lung: Secondary | ICD-10-CM

## 2015-09-15 DIAGNOSIS — Z85038 Personal history of other malignant neoplasm of large intestine: Secondary | ICD-10-CM | POA: Diagnosis not present

## 2015-09-15 DIAGNOSIS — R634 Abnormal weight loss: Secondary | ICD-10-CM | POA: Diagnosis not present

## 2015-09-15 DIAGNOSIS — G893 Neoplasm related pain (acute) (chronic): Secondary | ICD-10-CM

## 2015-09-15 DIAGNOSIS — C3491 Malignant neoplasm of unspecified part of right bronchus or lung: Secondary | ICD-10-CM | POA: Diagnosis present

## 2015-09-15 DIAGNOSIS — Z780 Asymptomatic menopausal state: Secondary | ICD-10-CM | POA: Diagnosis not present

## 2015-09-15 DIAGNOSIS — Z8639 Personal history of other endocrine, nutritional and metabolic disease: Secondary | ICD-10-CM | POA: Insufficient documentation

## 2015-09-15 DIAGNOSIS — Z862 Personal history of diseases of the blood and blood-forming organs and certain disorders involving the immune mechanism: Secondary | ICD-10-CM | POA: Diagnosis not present

## 2015-09-15 DIAGNOSIS — Z79899 Other long term (current) drug therapy: Secondary | ICD-10-CM | POA: Insufficient documentation

## 2015-09-15 DIAGNOSIS — E785 Hyperlipidemia, unspecified: Secondary | ICD-10-CM | POA: Diagnosis not present

## 2015-09-15 DIAGNOSIS — Z923 Personal history of irradiation: Secondary | ICD-10-CM | POA: Diagnosis not present

## 2015-09-15 DIAGNOSIS — R410 Disorientation, unspecified: Secondary | ICD-10-CM

## 2015-09-15 LAB — COMPREHENSIVE METABOLIC PANEL
ALBUMIN: 3.6 g/dL (ref 3.5–5.0)
ALT: 10 U/L — ABNORMAL LOW (ref 14–54)
ANION GAP: 6 (ref 5–15)
AST: 16 U/L (ref 15–41)
Alkaline Phosphatase: 101 U/L (ref 38–126)
BILIRUBIN TOTAL: 0.6 mg/dL (ref 0.3–1.2)
BUN: 31 mg/dL — AB (ref 6–20)
CHLORIDE: 102 mmol/L (ref 101–111)
CO2: 28 mmol/L (ref 22–32)
Calcium: 9.1 mg/dL (ref 8.9–10.3)
Creatinine, Ser: 1.07 mg/dL — ABNORMAL HIGH (ref 0.44–1.00)
GFR calc Af Amer: 55 mL/min — ABNORMAL LOW (ref >60)
GFR, EST NON AFRICAN AMERICAN: 48 mL/min — AB (ref >60)
GLUCOSE: 92 mg/dL (ref 65–99)
POTASSIUM: 4.2 mmol/L (ref 3.5–5.1)
Sodium: 136 mmol/L (ref 135–145)
TOTAL PROTEIN: 7.2 g/dL (ref 6.5–8.1)

## 2015-09-15 LAB — CBC WITH DIFFERENTIAL/PLATELET
BASOS PCT: 3 %
Basophils Absolute: 0.2 10*3/uL — ABNORMAL HIGH (ref 0–0.1)
EOS ABS: 0.2 10*3/uL (ref 0–0.7)
EOS PCT: 4 %
HCT: 41.4 % (ref 35.0–47.0)
HEMOGLOBIN: 14 g/dL (ref 12.0–16.0)
Lymphocytes Relative: 22 %
Lymphs Abs: 1.3 10*3/uL (ref 1.0–3.6)
MCH: 31 pg (ref 26.0–34.0)
MCHC: 33.9 g/dL (ref 32.0–36.0)
MCV: 91.4 fL (ref 80.0–100.0)
Monocytes Absolute: 0.5 10*3/uL (ref 0.2–0.9)
Monocytes Relative: 8 %
NEUTROS PCT: 63 %
Neutro Abs: 3.7 10*3/uL (ref 1.4–6.5)
PLATELETS: 220 10*3/uL (ref 150–440)
RBC: 4.53 MIL/uL (ref 3.80–5.20)
RDW: 16 % — ABNORMAL HIGH (ref 11.5–14.5)
WBC: 5.9 10*3/uL (ref 3.6–11.0)

## 2015-09-15 LAB — MAGNESIUM: MAGNESIUM: 1.5 mg/dL — AB (ref 1.7–2.4)

## 2015-09-15 MED ORDER — PREDNISONE 5 MG PO TABS: 10.0000 mg | ORAL_TABLET | Freq: Every day | ORAL | Status: DC

## 2015-09-15 MED ORDER — ALPRAZOLAM 0.25 MG PO TABS: 0.2500 mg | ORAL_TABLET | Freq: Two times a day (BID) | ORAL | Status: AC | PRN

## 2015-09-15 MED ORDER — HYDROCODONE-ACETAMINOPHEN 5-325 MG PO TABS: ORAL_TABLET | ORAL | Status: DC

## 2015-09-15 MED ORDER — HEPARIN SOD (PORK) LOCK FLUSH 100 UNIT/ML IV SOLN
500.0000 [IU] | Freq: Once | INTRAVENOUS | Status: AC
  Administered 2015-09-15: 500 [IU] via INTRAVENOUS
  Filled 2015-09-15: qty 5

## 2015-09-15 MED ORDER — SODIUM CHLORIDE 0.9 % IJ SOLN
10.0000 mL | Freq: Once | INTRAMUSCULAR | Status: AC
  Administered 2015-09-15: 10 mL via INTRAVENOUS
  Filled 2015-09-15: qty 10

## 2015-09-15 MED ORDER — MAGNESIUM SULFATE 2 GM/50ML IV SOLN
2.0000 g | Freq: Once | INTRAVENOUS | Status: AC
  Administered 2015-09-15: 2 g via INTRAVENOUS

## 2015-09-15 MED ORDER — MAGNESIUM SULFATE 50 % IJ SOLN
2.0000 g | Freq: Once | INTRAMUSCULAR | Status: DC
  Filled 2015-09-15: qty 4

## 2015-09-15 MED ORDER — SODIUM CHLORIDE 0.9 % IV SOLN
INTRAVENOUS | Status: DC
  Administered 2015-09-15: 10:00:00 via INTRAVENOUS
  Filled 2015-09-15: qty 1000

## 2015-09-15 NOTE — Progress Notes (Signed)
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 T1, N2, M0 tumor stage III Diagnoses by bronchoscopy in June of 2015 2.  Chronic tobacco abuse and long-term COPD 3.started chemotherapy with cis-platinum VP-16 June of 2015 . with concurrent radiation treatment 4.last chemotherapy was on September 16,2015   finished radiation therapy by end of September 2015.Marland Kitchen Prophylactic brain radiation has been discontinued because of progressive weakness in both lower extremity (November, 2015)    2.  CT scan of December, 2016 shows progressive disease.   INTERVAL HISTORY: Jacqueline Zamora  condition has been slowly declining.  Increasing cough and shortness of breath Family also reports patient is very forgetful.  Family is also noticed that patient neighbor complains about her forgetting number of things.  Losing weight.  Appetite is stable. Patient continues to have hypomagnesemia   no hemoptysis.  Had a repeat CT scan which has been evaluated independently reviewed and reviewed with the patient and family Patient also complains of pain in epigastric area. Patient continues to have bone pain had MRI scan of brain as well as PET scan which has been reviewed independently Her appetite and hypomagnesemia continues  REVIEW OF SYSTEMS:    general status:    declining performance statusNo chills.  No fever. HEENT: Alopecia.  No evidence of stomatitis Lungs: No cough or shortness of breath. Lower chest wall area pain is described about Continues to smoke Cardiac: No chest pain or paroxysmal nocturnal dyspnea GI:  anal in epigastric area constant dull achingin: No rash Lower extremity no swelling Neurological system: No tingling.  No numbness.   No other focal signs Musculoskeletal system no bony pains Has not lost any weight.  Appetite has been stable. As per HPI. Otherwise, a complete review of systems is negatve.   Significant History/PMH:   post menopausal:    polycythemia:    hypercalcemia:    lung cancer:    Pulmonary nodule:    Hyperlipidemia:    Colon or Rectal Cancer:    COPD:    Asthma:    Tonsillectomy:    Cataract Extraction:    Colon Resection:   Smoking History: Smoking History 0.5 Packs per day and has received prescription for chantix. Smoking Cessation Information Given to Patient .  PFSH: Comments: h/o of lung cancer or melanoma and thyroid cancer in the family  Social History: negative alcohol  Additional Past Medical and Surgical History: no significant history of any other medical illness  In 2004 patient had carcinoma of colon underwent surgery and no followup therapy    ADVANCED DIRECTIVES:  Patient does have advance healthcare directive, Patient   does not desire to make any changes  HEALTH MAINTENANCE: Social History  Substance Use Topics  . Smoking status: Current Every Day Smoker -- 1.00 packs/day for 62 years    Types: Cigarettes  . Smokeless tobacco: Never Used  . Alcohol Use: No      Allergies  Allergen Reactions  . Neomycin-Bacitracin Zn-Polymyx Rash  . Other Rash    Iodine Cleansing Solution and Dye  . Penicillins Rash    Current Outpatient Prescriptions  Medication Sig Dispense Refill  . ADVAIR DISKUS 250-50 MCG/DOSE AEPB     . ALPRAZolam (XANAX) 0.25 MG tablet  Take 1 tablet (0.25 mg total) by mouth 2 (two) times daily as needed for anxiety. 60 tablet 1  . Butenafine HCl 1 % cream Apply topically.    . Cholecalciferol (D 2000) 2000 UNITS TABS Take by mouth.    . Cyanocobalamin (RA VITAMIN B-12 TR) 1000 MCG TBCR Take by mouth.    . diltiazem (DILTIAZEM CD) 120 MG 24 hr capsule Take 120 mg by mouth daily.    . fluticasone (FLONASE) 50 MCG/ACT nasal spray  Place into the nose.    Marland Kitchen Fluticasone-Salmeterol (ADVAIR) 500-50 MCG/DOSE AEPB Inhale into the lungs.    Marland Kitchen ipratropium-albuterol (DUONEB) 0.5-2.5 (3) MG/3ML SOLN     . omeprazole (PRILOSEC) 20 MG capsule Take 1 capsule (20 mg total) by mouth 2 (two) times daily before a meal. 60 capsule 3  . albuterol (PROAIR HFA) 108 (90 BASE) MCG/ACT inhaler Inhale into the lungs.    Marland Kitchen azelastine (ASTELIN) 0.1 % nasal spray Place into the nose.     No current facility-administered medications for this visit.   Facility-Administered Medications Ordered in Other Visits  Medication Dose Route Frequency Provider Last Rate Last Dose  . fludeoxyglucose F - 18 (FDG) injection 12.63 milli Curie  12.63 milli Curie Intravenous Once PRN Forest Gleason, MD      . heparin lock flush 100 unit/mL  500 Units Intravenous Once Forest Gleason, MD      . sodium chloride 0.9 % injection 10 mL  10 mL Intracatheter PRN Forest Gleason, MD   10 mL at 04/17/15 1339    OBJECTIVE:  Filed Vitals:   09/15/15 0844  BP: 124/83  Pulse: 118  Temp: 96 F (35.6 C)     Body mass index is 19.63 kg/(m^2).    ECOG FS:1 - Symptomatic but completely ambulatory  PHYSICAL EXAM: GENERAL status: Performance status is good.  Patient has not lost significant weight HEENT: No evidence of stomatitis. Sclera and conjunctivae :: No jaundice.   pale looking. Lungs: Air  entry equal on both sides.  No rhonchi.  No rales.  There is tenderness in the left lower rib cage area Emphysematous chest Cardiac: Heart sounds are normal.  No pericardial rub.  No murmur. Lymphatic system: Cervical, axillary, inguinal, lymph nodes not palpable GI: Abdomen is soft.  No ascites.  Liver spleen not palpable.  No tenderness.  Bowel sounds are within normal limit Lower extremity: No edema Neurological system: Higher functions, cranial nerves intact no evidence of peripheral neuropathy. Skin: No rash.  No ecchymosis.Marland Kitchen   LAB RESULTS:  Appointment on 09/15/2015  Component  Date Value Ref Range Status  . WBC 09/15/2015 5.9  3.6 - 11.0 K/uL Final  . RBC 09/15/2015 4.53  3.80 - 5.20 MIL/uL Final  . Hemoglobin 09/15/2015 14.0  12.0 - 16.0 g/dL Final  . HCT 09/15/2015 41.4  35.0 - 47.0 % Final  . MCV 09/15/2015 91.4  80.0 - 100.0 fL Final  . MCH 09/15/2015 31.0  26.0 - 34.0 pg Final  . MCHC 09/15/2015 33.9  32.0 - 36.0 g/dL Final  . RDW 09/15/2015 16.0* 11.5 - 14.5 % Final  . Platelets 09/15/2015 220  150 - 440 K/uL Final  . Neutrophils Relative % 09/15/2015 63   Final  . Neutro Abs 09/15/2015 3.7  1.4 - 6.5 K/uL Final  . Lymphocytes Relative 09/15/2015 22   Final  . Lymphs Abs 09/15/2015 1.3  1.0 - 3.6 K/uL Final  . Monocytes Relative 09/15/2015 8   Final  . Monocytes  Absolute 09/15/2015 0.5  0.2 - 0.9 K/uL Final  . Eosinophils Relative 09/15/2015 4   Final  . Eosinophils Absolute 09/15/2015 0.2  0 - 0.7 K/uL Final  . Basophils Relative 09/15/2015 3   Final  . Basophils Absolute 09/15/2015 0.2* 0 - 0.1 K/uL Final  . Sodium 09/15/2015 136  135 - 145 mmol/L Final  . Potassium 09/15/2015 4.2  3.5 - 5.1 mmol/L Final  . Chloride 09/15/2015 102  101 - 111 mmol/L Final  . CO2 09/15/2015 28  22 - 32 mmol/L Final  . Glucose, Bld 09/15/2015 92  65 - 99 mg/dL Final  . BUN 09/15/2015 31* 6 - 20 mg/dL Final  . Creatinine, Ser 09/15/2015 1.07* 0.44 - 1.00 mg/dL Final  . Calcium 09/15/2015 9.1  8.9 - 10.3 mg/dL Final  . Total Protein 09/15/2015 7.2  6.5 - 8.1 g/dL Final  . Albumin 09/15/2015 3.6  3.5 - 5.0 g/dL Final  . AST 09/15/2015 16  15 - 41 U/L Final  . ALT 09/15/2015 10* 14 - 54 U/L Final  . Alkaline Phosphatase 09/15/2015 101  38 - 126 U/L Final  . Total Bilirubin 09/15/2015 0.6  0.3 - 1.2 mg/dL Final  . GFR calc non Af Amer 09/15/2015 48* >60 mL/min Final  . GFR calc Af Amer 09/15/2015 55* >60 mL/min Final   Comment: (NOTE) The eGFR has been calculated using the CKD EPI equation. This calculation has not been validated in all clinical situations. eGFR's  persistently <60 mL/min signify possible Chronic Kidney Disease.   . Anion gap 09/15/2015 6  5 - 15 Final  . Magnesium 09/15/2015 1.5* 1.7 - 2.4 mg/dL Final   Oncology History       IMPRESSION: 1. Findings, as above, concerning for local recurrence of disease in the medial segment of the right middle lobe where there is an enlarging 3.1 x 2.5 x 2.2 cm mass like opacity. Correlation with PET-CT is strongly recommended at this time. 2. Atherosclerosis, including 3 vessel coronary artery disease. 3. Mild diffuse bronchial thickening with mild centrilobular and paraseptal emphysema; imaging findings suggestive of underlying COPD. 4. Additional incidental findings, as above. These results will be called to the ordering clinician or representative by the Radiologist Assistant, and communication documented in the PACS or zVision Dashboard.   ASSESSMENT: Small cell undifferentiated carcinoma of lung  CT scan has been reviewed independently and patient's shows progressive disease.   MRI scan of brain has been reviewed independently there is a skull lesion which appears to be new.  There is no evidence of metastases to the brain.  PET scan has been reviewed shows some slight increased activity in the right lower lobe as well as some areas in the bone and the skull as well as in the second grams may be consistent with bone metastases There is no corresponding lesion on the CT scan  Patient is minimally symptomatic with bone pain 3.progressive dementia  Delayed effect of radiation therapy to the brain versus metastases   I had detailed discussion with patient and family Regarding possibility of recurrent disease.  Considering patient's age and minimally symptomatic I suggested we wait for any further therapy Continue patient on Xanax Start patient on half tablet of Vicodin 4 times a day when necessary for pain. In 4 weeks patient will have repeat bone scan to further define any bony  lesions appropriate therapy at that time would be consideration of XGEVA, palliative radiation therapy bony pains are painful.  Possibility of further  chemotherapy but that has to be balanced against Patient general condition and her desire about ongoing treatment and side effects    Hypomagnesemia Proceed with 2 g of IV magnesium and recheck magnesium once a month..      MEDICAL DECISION MAKING:Review of CT scan Ordering PET scan  IV 2 g of magnesium.  Ordering MRI scan of brain  Total duration of visit was 45 minutes.  50% or more time was spent in counseling patient and family regarding prognosis . And social issue of managing care of the patient. It was decided that patient will move to her daughter's home to ensure her safety while workup is in progress   Patient expressed understanding and was in agreement with this plan. She also understands that She can call clinic at any time with any questions, concerns, or complaints.    No matching staging information was found for the patient.  Forest Gleason, MD   09/15/2015 9:01 AM

## 2015-09-15 NOTE — Progress Notes (Signed)
Patient c/o feeling weak.  Also requesting refill for Xanax.

## 2015-10-20 ENCOUNTER — Ambulatory Visit
Admission: RE | Admit: 2015-10-20 | Discharge: 2015-10-20 | Disposition: A | Discharge status: Home / Self Care | Payer: Medicare Other | Source: Ambulatory Visit | Attending: Oncology | Admitting: Oncology

## 2015-10-20 DIAGNOSIS — M545 Low back pain: Secondary | ICD-10-CM | POA: Insufficient documentation

## 2015-10-20 DIAGNOSIS — C3490 Malignant neoplasm of unspecified part of unspecified bronchus or lung: Secondary | ICD-10-CM | POA: Diagnosis not present

## 2015-10-20 DIAGNOSIS — C7951 Secondary malignant neoplasm of bone: Secondary | ICD-10-CM | POA: Diagnosis present

## 2015-10-20 DIAGNOSIS — M4854XA Collapsed vertebra, not elsewhere classified, thoracic region, initial encounter for fracture: Secondary | ICD-10-CM | POA: Insufficient documentation

## 2015-10-20 DIAGNOSIS — Z85038 Personal history of other malignant neoplasm of large intestine: Secondary | ICD-10-CM | POA: Diagnosis not present

## 2015-10-20 MED ORDER — TECHNETIUM TC 99M MEDRONATE IV KIT
25.0000 | PACK | Freq: Once | INTRAVENOUS | Status: AC | PRN
  Administered 2015-10-20: 23.6 via INTRAVENOUS

## 2015-10-21 ENCOUNTER — Inpatient Hospital Stay (HOSPITAL_BASED_OUTPATIENT_CLINIC_OR_DEPARTMENT_OTHER): Payer: Medicare Other | Admitting: Oncology

## 2015-10-21 ENCOUNTER — Inpatient Hospital Stay: Payer: Medicare Other | Attending: Oncology

## 2015-10-21 ENCOUNTER — Encounter: Payer: Self-pay | Admitting: Oncology

## 2015-10-21 ENCOUNTER — Inpatient Hospital Stay: Payer: Medicare Other

## 2015-10-21 VITALS — BP 109/70 | HR 105 | Temp 97.1°F | Resp 18 | Wt 120.0 lb

## 2015-10-21 DIAGNOSIS — C3491 Malignant neoplasm of unspecified part of right bronchus or lung: Secondary | ICD-10-CM

## 2015-10-21 DIAGNOSIS — Z923 Personal history of irradiation: Secondary | ICD-10-CM | POA: Diagnosis not present

## 2015-10-21 DIAGNOSIS — Z79899 Other long term (current) drug therapy: Secondary | ICD-10-CM | POA: Diagnosis not present

## 2015-10-21 DIAGNOSIS — C3490 Malignant neoplasm of unspecified part of unspecified bronchus or lung: Secondary | ICD-10-CM

## 2015-10-21 DIAGNOSIS — Z862 Personal history of diseases of the blood and blood-forming organs and certain disorders involving the immune mechanism: Secondary | ICD-10-CM | POA: Diagnosis not present

## 2015-10-21 DIAGNOSIS — M899 Disorder of bone, unspecified: Secondary | ICD-10-CM

## 2015-10-21 DIAGNOSIS — F17210 Nicotine dependence, cigarettes, uncomplicated: Secondary | ICD-10-CM

## 2015-10-21 DIAGNOSIS — G893 Neoplasm related pain (acute) (chronic): Secondary | ICD-10-CM

## 2015-10-21 DIAGNOSIS — E785 Hyperlipidemia, unspecified: Secondary | ICD-10-CM | POA: Insufficient documentation

## 2015-10-21 DIAGNOSIS — Z85038 Personal history of other malignant neoplasm of large intestine: Secondary | ICD-10-CM | POA: Insufficient documentation

## 2015-10-21 DIAGNOSIS — J45909 Unspecified asthma, uncomplicated: Secondary | ICD-10-CM | POA: Insufficient documentation

## 2015-10-21 DIAGNOSIS — J449 Chronic obstructive pulmonary disease, unspecified: Secondary | ICD-10-CM

## 2015-10-21 DIAGNOSIS — F0390 Unspecified dementia without behavioral disturbance: Secondary | ICD-10-CM | POA: Insufficient documentation

## 2015-10-21 DIAGNOSIS — Z7952 Long term (current) use of systemic steroids: Secondary | ICD-10-CM | POA: Insufficient documentation

## 2015-10-21 DIAGNOSIS — Z9221 Personal history of antineoplastic chemotherapy: Secondary | ICD-10-CM | POA: Diagnosis not present

## 2015-10-21 LAB — COMPREHENSIVE METABOLIC PANEL
ALT: 15 U/L (ref 14–54)
ANION GAP: 7 (ref 5–15)
AST: 16 U/L (ref 15–41)
Albumin: 3.8 g/dL (ref 3.5–5.0)
Alkaline Phosphatase: 77 U/L (ref 38–126)
BUN: 32 mg/dL — ABNORMAL HIGH (ref 6–20)
CALCIUM: 9 mg/dL (ref 8.9–10.3)
CHLORIDE: 100 mmol/L — AB (ref 101–111)
CO2: 28 mmol/L (ref 22–32)
Creatinine, Ser: 1.06 mg/dL — ABNORMAL HIGH (ref 0.44–1.00)
GFR, EST AFRICAN AMERICAN: 56 mL/min — AB (ref >60)
GFR, EST NON AFRICAN AMERICAN: 48 mL/min — AB (ref >60)
Glucose, Bld: 159 mg/dL — ABNORMAL HIGH (ref 65–99)
Potassium: 4.5 mmol/L (ref 3.5–5.1)
SODIUM: 135 mmol/L (ref 135–145)
Total Bilirubin: 0.5 mg/dL (ref 0.3–1.2)
Total Protein: 7.2 g/dL (ref 6.5–8.1)

## 2015-10-21 LAB — CBC WITH DIFFERENTIAL/PLATELET
Basophils Absolute: 0 10*3/uL (ref 0–0.1)
Basophils Relative: 1 %
EOS ABS: 0 10*3/uL (ref 0–0.7)
EOS PCT: 0 %
HCT: 45.3 % (ref 35.0–47.0)
Hemoglobin: 15.4 g/dL (ref 12.0–16.0)
LYMPHS ABS: 0.6 10*3/uL — AB (ref 1.0–3.6)
Lymphocytes Relative: 9 %
MCH: 31.5 pg (ref 26.0–34.0)
MCHC: 34 g/dL (ref 32.0–36.0)
MCV: 92.4 fL (ref 80.0–100.0)
MONO ABS: 0.1 10*3/uL — AB (ref 0.2–0.9)
MONOS PCT: 2 %
Neutro Abs: 5.5 10*3/uL (ref 1.4–6.5)
Neutrophils Relative %: 88 %
PLATELETS: 192 10*3/uL (ref 150–440)
RBC: 4.9 MIL/uL (ref 3.80–5.20)
RDW: 15 % — AB (ref 11.5–14.5)
WBC: 6.2 10*3/uL (ref 3.6–11.0)

## 2015-10-21 LAB — MAGNESIUM: MAGNESIUM: 1.5 mg/dL — AB (ref 1.7–2.4)

## 2015-10-21 MED ORDER — MAGNESIUM SULFATE 2 GM/50ML IV SOLN
2.0000 g | Freq: Once | INTRAVENOUS | Status: AC
  Administered 2015-10-21: 2 g via INTRAVENOUS
  Filled 2015-10-21: qty 50

## 2015-10-21 MED ORDER — HEPARIN SOD (PORK) LOCK FLUSH 100 UNIT/ML IV SOLN
500.0000 [IU] | Freq: Once | INTRAVENOUS | Status: AC
  Administered 2015-10-21: 500 [IU] via INTRAVENOUS

## 2015-10-21 MED ORDER — HYDROCODONE-ACETAMINOPHEN 5-325 MG PO TABS: ORAL_TABLET | ORAL | Status: DC

## 2015-10-21 MED ORDER — HEPARIN SOD (PORK) LOCK FLUSH 100 UNIT/ML IV SOLN
INTRAVENOUS | Status: AC
  Filled 2015-10-21: qty 5

## 2015-10-21 MED ORDER — SODIUM CHLORIDE 0.9 % IV SOLN
INTRAVENOUS | Status: DC
  Administered 2015-10-21: 14:00:00 via INTRAVENOUS
  Filled 2015-10-21: qty 1000

## 2015-10-21 NOTE — Progress Notes (Signed)
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 T1, N2, M0 tumor stage III Diagnoses by bronchoscopy in June of 2015 2.  Chronic tobacco abuse and long-term COPD 3.started chemotherapy with cis-platinum VP-16 June of 2015 . with concurrent radiation treatment 4.last chemotherapy was on September 16,2015   finished radiation therapy by end of September 2015.Marland Kitchen Prophylactic brain radiation has been discontinued because of progressive weakness in both lower extremity (November, 2015)    2.  CT scan of December, 2016 shows progressive disease.   INTERVAL HISTORY: Jacqueline Zamora  condition has been slowly declining.  Increasing cough and shortness of breath Family also reports patient is very forgetful.  Family is also noticed that patient neighbor independently 39 and is moved with her family. Overall doing very well.  Patient had a bone scan which has been reviewed independently previous MRI scan revealed new frontal lesion. Patient is here for ongoing evaluation and treatment consideration.  REVIEW OF SYSTEMS:    general status:    declining performance statusNo chills.  No fever. HEENT: Alopecia.  No evidence of stomatitis Lungs: No cough or shortness of breath. Lower chest wall area pain is described about Continues to smoke Cardiac: No chest pain or paroxysmal nocturnal dyspnea GI:  anal in epigastric area constant dull achingin: No rash Lower extremity no swelling Neurological system: No tingling.  No numbness.  No other focal signs Musculoskeletal system no bony pains Has not lost any weight.  Appetite has been stable. As per HPI. Otherwise, a complete review of systems is negatve.   Significant History/PMH:     post menopausal:    polycythemia:    hypercalcemia:    lung cancer:    Pulmonary nodule:    Hyperlipidemia:    Colon or Rectal Cancer:    COPD:    Asthma:    Tonsillectomy:    Cataract Extraction:    Colon Resection:   Smoking History: Smoking History 0.5 Packs per day and has received prescription for chantix. Smoking Cessation Information Given to Patient .  PFSH: Comments: h/o of lung cancer or melanoma and thyroid cancer in the family  Social History: negative alcohol  Additional Past Medical and Surgical History: no significant history of any other medical illness  In 2004 patient had carcinoma of colon underwent surgery and no followup therapy    ADVANCED DIRECTIVES:  Patient does have advance healthcare directive, Patient   does not desire to make any changes  HEALTH MAINTENANCE: Social History  Substance Use Topics  . Smoking status: Current Every Day Smoker -- 1.00 packs/day for 62 years    Types: Cigarettes  . Smokeless tobacco: Never Used  . Alcohol Use: No      Allergies  Allergen Reactions  . Neomycin-Bacitracin Zn-Polymyx Rash  . Other Rash    Iodine Cleansing Solution and Dye  . Penicillins Rash    Current Outpatient Prescriptions  Medication Sig Dispense Refill  . ADVAIR DISKUS 250-50 MCG/DOSE AEPB     . albuterol (PROAIR HFA) 108 (90 Base) MCG/ACT inhaler Inhale into the lungs.    . ALPRAZolam (XANAX) 0.25 MG tablet Take 1 tablet (0.25 mg total) by mouth 2 (two) times daily as needed for anxiety. 60 tablet 1  .  Butenafine HCl 1 % cream Apply topically.    . Cholecalciferol (D 2000) 2000 UNITS TABS Take by mouth.    . Cyanocobalamin (RA VITAMIN B-12 TR) 1000 MCG TBCR Take by mouth.    . diltiazem (DILTIAZEM CD) 120 MG 24 hr capsule Take 120 mg by mouth daily.    . fluticasone (FLONASE) 50 MCG/ACT nasal spray Place into the nose.    Marland Kitchen Fluticasone-Salmeterol (ADVAIR) 500-50 MCG/DOSE AEPB Inhale into the lungs.    Marland Kitchen  HYDROcodone-acetaminophen (NORCO/VICODIN) 5-325 MG tablet 1/2 to 1 tablet Q 6hrs prn pain 30 tablet 0  . ipratropium-albuterol (DUONEB) 0.5-2.5 (3) MG/3ML SOLN     . omeprazole (PRILOSEC) 20 MG capsule Take 1 capsule (20 mg total) by mouth 2 (two) times daily before a meal. 60 capsule 3  . predniSONE (DELTASONE) 5 MG tablet Take 2 tablets (10 mg total) by mouth daily with breakfast. 60 tablet 1  . albuterol (PROAIR HFA) 108 (90 BASE) MCG/ACT inhaler Inhale into the lungs.    Marland Kitchen azelastine (ASTELIN) 0.1 % nasal spray Place into the nose.     No current facility-administered medications for this visit.   Facility-Administered Medications Ordered in Other Visits  Medication Dose Route Frequency Provider Last Rate Last Dose  . magnesium sulfate IVPB 2 g 50 mL  2 g Intravenous Once Forest Gleason, MD      . sodium chloride 0.9 % injection 10 mL  10 mL Intracatheter PRN Forest Gleason, MD   10 mL at 04/17/15 1339    OBJECTIVE:  Filed Vitals:   10/21/15 1338  BP: 109/70  Pulse: 105  Temp: 97.1 F (36.2 C)  Resp: 18     Body mass index is 20.59 kg/(m^2).    ECOG FS:1 - Symptomatic but completely ambulatory  PHYSICAL EXAM: GENERAL status: Performance status is good.  Patient has not lost significant weight HEENT: No evidence of stomatitis. Sclera and conjunctivae :: No jaundice.   pale looking. Lungs: Air  entry equal on both sides.  No rhonchi.  No rales.  There is tenderness in the left lower rib cage area Emphysematous chest Cardiac: Heart sounds are normal.  No pericardial rub.  No murmur. Lymphatic system: Cervical, axillary, inguinal, lymph nodes not palpable GI: Abdomen is soft.  No ascites.  Liver spleen not palpable.  No tenderness.  Bowel sounds are within normal limit Lower extremity: No edema Neurological system: Higher functions, cranial nerves intact no evidence of peripheral neuropathy. Skin: No rash.  No ecchymosis.Marland Kitchen   LAB RESULTS:  Appointment on 10/21/2015  Component Date  Value Ref Range Status  . WBC 10/21/2015 6.2  3.6 - 11.0 K/uL Final  . RBC 10/21/2015 4.90  3.80 - 5.20 MIL/uL Final  . Hemoglobin 10/21/2015 15.4  12.0 - 16.0 g/dL Final  . HCT 10/21/2015 45.3  35.0 - 47.0 % Final  . MCV 10/21/2015 92.4  80.0 - 100.0 fL Final  . MCH 10/21/2015 31.5  26.0 - 34.0 pg Final  . MCHC 10/21/2015 34.0  32.0 - 36.0 g/dL Final  . RDW 10/21/2015 15.0* 11.5 - 14.5 % Final  . Platelets 10/21/2015 192  150 - 440 K/uL Final  . Neutrophils Relative % 10/21/2015 88   Final  . Neutro Abs 10/21/2015 5.5  1.4 - 6.5 K/uL Final  . Lymphocytes Relative 10/21/2015 9   Final  . Lymphs Abs 10/21/2015 0.6* 1.0 - 3.6 K/uL Final  . Monocytes Relative 10/21/2015 2   Final  . Monocytes Absolute 10/21/2015 0.1*  0.2 - 0.9 K/uL Final  . Eosinophils Relative 10/21/2015 0   Final  . Eosinophils Absolute 10/21/2015 0.0  0 - 0.7 K/uL Final  . Basophils Relative 10/21/2015 1   Final  . Basophils Absolute 10/21/2015 0.0  0 - 0.1 K/uL Final  . Sodium 10/21/2015 135  135 - 145 mmol/L Final  . Potassium 10/21/2015 4.5  3.5 - 5.1 mmol/L Final  . Chloride 10/21/2015 100* 101 - 111 mmol/L Final  . CO2 10/21/2015 28  22 - 32 mmol/L Final  . Glucose, Bld 10/21/2015 159* 65 - 99 mg/dL Final  . BUN 10/21/2015 32* 6 - 20 mg/dL Final  . Creatinine, Ser 10/21/2015 1.06* 0.44 - 1.00 mg/dL Final  . Calcium 10/21/2015 9.0  8.9 - 10.3 mg/dL Final  . Total Protein 10/21/2015 7.2  6.5 - 8.1 g/dL Final  . Albumin 10/21/2015 3.8  3.5 - 5.0 g/dL Final  . AST 10/21/2015 16  15 - 41 U/L Final  . ALT 10/21/2015 15  14 - 54 U/L Final  . Alkaline Phosphatase 10/21/2015 77  38 - 126 U/L Final  . Total Bilirubin 10/21/2015 0.5  0.3 - 1.2 mg/dL Final  . GFR calc non Af Amer 10/21/2015 48* >60 mL/min Final  . GFR calc Af Amer 10/21/2015 56* >60 mL/min Final   Comment: (NOTE) The eGFR has been calculated using the CKD EPI equation. This calculation has not been validated in all clinical situations. eGFR's  persistently <60 mL/min signify possible Chronic Kidney Disease.   . Anion gap 10/21/2015 7  5 - 15 Final  . Magnesium 10/21/2015 1.5* 1.7 - 2.4 mg/dL Final   Oncology History         ASSESSMENT: Small cell undifferentiated carcinoma of lung  CT scan has been reviewed independently and patient's shows progressive disease.   MRI scan of brain has been reviewed independently there is a skull lesion which appears to be new.  There is no evidence of metastases to the brain.  PET scan has been reviewed shows some slight increased activity in the right lower lobe as well as some areas in the bone and the skull as well as in the second grams may be consistent with bone metastases There is no corresponding lesion on the CT scan  Patient is minimally symptomatic with bone pain 3.progressive dementia  Delayed effect of radiation therapy to the brain versus metastases   I had detailed discussion with patient and family Regarding possibility of recurrent disease.  Considering patient's age and minimally symptomatic I suggested we wait for any further therapy Continue patient on Xanax  Patient is here for ongoing evaluation and treatment consideration Bone scan is Completely normal.   Isolated lesion in this MRI of skull would be followed. Hypomagnesemia was supplemental intravenous magnesium     Hypomagnesemia Proceed with 2 g of IV magnesium and recheck magnesium once a month..     Reevaluate patient in 6 weeks with repeat blood count and magnesium level  Patient expressed understanding and was in agreement with this plan. She also understands that She can call clinic at any time with any questions, concerns, or complaints.    No matching staging information was found for the patient.  Forest Gleason, MD   10/21/2015 2:15 PM

## 2015-11-03 ENCOUNTER — Other Ambulatory Visit: Payer: Self-pay | Admitting: Oncology

## 2015-12-02 ENCOUNTER — Inpatient Hospital Stay: Payer: Medicare Other

## 2015-12-02 ENCOUNTER — Inpatient Hospital Stay: Payer: Medicare Other | Attending: Oncology

## 2015-12-02 ENCOUNTER — Inpatient Hospital Stay: Payer: Medicare Other | Admitting: Family Medicine

## 2015-12-03 ENCOUNTER — Other Ambulatory Visit: Payer: Self-pay | Admitting: *Deleted

## 2015-12-03 ENCOUNTER — Inpatient Hospital Stay: Payer: Medicare Other

## 2015-12-03 DIAGNOSIS — C3490 Malignant neoplasm of unspecified part of unspecified bronchus or lung: Secondary | ICD-10-CM

## 2015-12-03 DIAGNOSIS — G893 Neoplasm related pain (acute) (chronic): Secondary | ICD-10-CM

## 2015-12-03 LAB — COMPREHENSIVE METABOLIC PANEL
ALBUMIN: 3.3 g/dL — AB (ref 3.5–5.0)
ALK PHOS: 64 U/L (ref 38–126)
ALT: 14 U/L (ref 14–54)
ANION GAP: 6 (ref 5–15)
AST: 19 U/L (ref 15–41)
BUN: 27 mg/dL — ABNORMAL HIGH (ref 6–20)
CALCIUM: 8.6 mg/dL — AB (ref 8.9–10.3)
CHLORIDE: 99 mmol/L — AB (ref 101–111)
CO2: 30 mmol/L (ref 22–32)
Creatinine, Ser: 1.34 mg/dL — ABNORMAL HIGH (ref 0.44–1.00)
GFR calc non Af Amer: 36 mL/min — ABNORMAL LOW (ref >60)
GFR, EST AFRICAN AMERICAN: 42 mL/min — AB (ref >60)
Glucose, Bld: 133 mg/dL — ABNORMAL HIGH (ref 65–99)
Potassium: 3.7 mmol/L (ref 3.5–5.1)
SODIUM: 135 mmol/L (ref 135–145)
Total Bilirubin: 0.3 mg/dL (ref 0.3–1.2)
Total Protein: 7.3 g/dL (ref 6.5–8.1)

## 2015-12-03 LAB — CBC WITH DIFFERENTIAL/PLATELET
BASOS PCT: 1 %
Basophils Absolute: 0.1 10*3/uL (ref 0–0.1)
EOS ABS: 0 10*3/uL (ref 0–0.7)
EOS PCT: 0 %
HCT: 43.6 % (ref 35.0–47.0)
HEMOGLOBIN: 14.6 g/dL (ref 12.0–16.0)
Lymphocytes Relative: 9 %
Lymphs Abs: 0.5 10*3/uL — ABNORMAL LOW (ref 1.0–3.6)
MCH: 31 pg (ref 26.0–34.0)
MCHC: 33.6 g/dL (ref 32.0–36.0)
MCV: 92.3 fL (ref 80.0–100.0)
MONOS PCT: 7 %
Monocytes Absolute: 0.4 10*3/uL (ref 0.2–0.9)
NEUTROS PCT: 83 %
Neutro Abs: 4.8 10*3/uL (ref 1.4–6.5)
PLATELETS: 198 10*3/uL (ref 150–440)
RBC: 4.72 MIL/uL (ref 3.80–5.20)
RDW: 14.2 % (ref 11.5–14.5)
WBC: 5.8 10*3/uL (ref 3.6–11.0)

## 2015-12-03 LAB — MAGNESIUM: Magnesium: 1.3 mg/dL — ABNORMAL LOW (ref 1.7–2.4)

## 2015-12-04 ENCOUNTER — Inpatient Hospital Stay
Admission: EM | Admit: 2015-12-04 | Discharge: 2015-12-11 | DRG: 871 | Disposition: A | Discharge status: Skilled Nursing Facility | Payer: Medicare Other | Attending: Internal Medicine | Admitting: Internal Medicine

## 2015-12-04 ENCOUNTER — Encounter: Payer: Self-pay | Admitting: Emergency Medicine

## 2015-12-04 ENCOUNTER — Inpatient Hospital Stay: Payer: Medicare Other

## 2015-12-04 ENCOUNTER — Emergency Department: Payer: Medicare Other

## 2015-12-04 DIAGNOSIS — Z923 Personal history of irradiation: Secondary | ICD-10-CM

## 2015-12-04 DIAGNOSIS — C3490 Malignant neoplasm of unspecified part of unspecified bronchus or lung: Secondary | ICD-10-CM | POA: Diagnosis present

## 2015-12-04 DIAGNOSIS — J440 Chronic obstructive pulmonary disease with acute lower respiratory infection: Secondary | ICD-10-CM | POA: Diagnosis present

## 2015-12-04 DIAGNOSIS — B961 Klebsiella pneumoniae [K. pneumoniae] as the cause of diseases classified elsewhere: Secondary | ICD-10-CM | POA: Diagnosis present

## 2015-12-04 DIAGNOSIS — F17210 Nicotine dependence, cigarettes, uncomplicated: Secondary | ICD-10-CM | POA: Diagnosis present

## 2015-12-04 DIAGNOSIS — G3184 Mild cognitive impairment, so stated: Secondary | ICD-10-CM | POA: Diagnosis present

## 2015-12-04 DIAGNOSIS — A4101 Sepsis due to Methicillin susceptible Staphylococcus aureus: Secondary | ICD-10-CM | POA: Diagnosis not present

## 2015-12-04 DIAGNOSIS — F419 Anxiety disorder, unspecified: Secondary | ICD-10-CM | POA: Diagnosis present

## 2015-12-04 DIAGNOSIS — R0902 Hypoxemia: Secondary | ICD-10-CM

## 2015-12-04 DIAGNOSIS — Z85118 Personal history of other malignant neoplasm of bronchus and lung: Secondary | ICD-10-CM

## 2015-12-04 DIAGNOSIS — Z888 Allergy status to other drugs, medicaments and biological substances status: Secondary | ICD-10-CM

## 2015-12-04 DIAGNOSIS — Z808 Family history of malignant neoplasm of other organs or systems: Secondary | ICD-10-CM

## 2015-12-04 DIAGNOSIS — J15211 Pneumonia due to Methicillin susceptible Staphylococcus aureus: Secondary | ICD-10-CM | POA: Diagnosis present

## 2015-12-04 DIAGNOSIS — J449 Chronic obstructive pulmonary disease, unspecified: Secondary | ICD-10-CM | POA: Diagnosis present

## 2015-12-04 DIAGNOSIS — Z79899 Other long term (current) drug therapy: Secondary | ICD-10-CM

## 2015-12-04 DIAGNOSIS — J441 Chronic obstructive pulmonary disease with (acute) exacerbation: Secondary | ICD-10-CM | POA: Diagnosis present

## 2015-12-04 DIAGNOSIS — Z91040 Latex allergy status: Secondary | ICD-10-CM

## 2015-12-04 DIAGNOSIS — A419 Sepsis, unspecified organism: Secondary | ICD-10-CM | POA: Diagnosis present

## 2015-12-04 DIAGNOSIS — R000 Tachycardia, unspecified: Secondary | ICD-10-CM | POA: Diagnosis present

## 2015-12-04 DIAGNOSIS — R6520 Severe sepsis without septic shock: Secondary | ICD-10-CM | POA: Diagnosis present

## 2015-12-04 DIAGNOSIS — Z880 Allergy status to penicillin: Secondary | ICD-10-CM

## 2015-12-04 DIAGNOSIS — E785 Hyperlipidemia, unspecified: Secondary | ICD-10-CM | POA: Diagnosis present

## 2015-12-04 DIAGNOSIS — J189 Pneumonia, unspecified organism: Secondary | ICD-10-CM

## 2015-12-04 DIAGNOSIS — Z803 Family history of malignant neoplasm of breast: Secondary | ICD-10-CM

## 2015-12-04 DIAGNOSIS — J9601 Acute respiratory failure with hypoxia: Secondary | ICD-10-CM | POA: Diagnosis present

## 2015-12-04 DIAGNOSIS — I5031 Acute diastolic (congestive) heart failure: Secondary | ICD-10-CM | POA: Diagnosis present

## 2015-12-04 DIAGNOSIS — N390 Urinary tract infection, site not specified: Secondary | ICD-10-CM | POA: Diagnosis present

## 2015-12-04 DIAGNOSIS — E559 Vitamin D deficiency, unspecified: Secondary | ICD-10-CM | POA: Diagnosis present

## 2015-12-04 DIAGNOSIS — Z7952 Long term (current) use of systemic steroids: Secondary | ICD-10-CM

## 2015-12-04 LAB — TROPONIN I: TROPONIN I: 0.05 ng/mL — AB (ref <0.031)

## 2015-12-04 LAB — URINALYSIS COMPLETE WITH MICROSCOPIC (ARMC ONLY)
BILIRUBIN URINE: NEGATIVE
GLUCOSE, UA: NEGATIVE mg/dL
Ketones, ur: NEGATIVE mg/dL
Nitrite: NEGATIVE
Protein, ur: 30 mg/dL — AB
SQUAMOUS EPITHELIAL / LPF: NONE SEEN
Specific Gravity, Urine: 1.015 (ref 1.005–1.030)
pH: 5 (ref 5.0–8.0)

## 2015-12-04 LAB — COMPREHENSIVE METABOLIC PANEL
ALT: 14 U/L (ref 14–54)
AST: 24 U/L (ref 15–41)
Albumin: 3 g/dL — ABNORMAL LOW (ref 3.5–5.0)
Alkaline Phosphatase: 63 U/L (ref 38–126)
Anion gap: 2 — ABNORMAL LOW (ref 5–15)
BUN: 34 mg/dL — AB (ref 6–20)
CHLORIDE: 101 mmol/L (ref 101–111)
CO2: 31 mmol/L (ref 22–32)
Calcium: 8.1 mg/dL — ABNORMAL LOW (ref 8.9–10.3)
Creatinine, Ser: 1.28 mg/dL — ABNORMAL HIGH (ref 0.44–1.00)
GFR calc Af Amer: 45 mL/min — ABNORMAL LOW (ref >60)
GFR, EST NON AFRICAN AMERICAN: 38 mL/min — AB (ref >60)
Glucose, Bld: 134 mg/dL — ABNORMAL HIGH (ref 65–99)
Potassium: 4.4 mmol/L (ref 3.5–5.1)
Sodium: 134 mmol/L — ABNORMAL LOW (ref 135–145)
Total Bilirubin: 0.5 mg/dL (ref 0.3–1.2)
Total Protein: 6.6 g/dL (ref 6.5–8.1)

## 2015-12-04 LAB — CBC WITH DIFFERENTIAL/PLATELET
BASOS ABS: 0.1 10*3/uL (ref 0–0.1)
Basophils Relative: 1 %
EOS PCT: 0 %
Eosinophils Absolute: 0 10*3/uL (ref 0–0.7)
HCT: 42.2 % (ref 35.0–47.0)
Hemoglobin: 14.1 g/dL (ref 12.0–16.0)
LYMPHS ABS: 0.6 10*3/uL — AB (ref 1.0–3.6)
LYMPHS PCT: 7 %
MCH: 30.8 pg (ref 26.0–34.0)
MCHC: 33.3 g/dL (ref 32.0–36.0)
MCV: 92.4 fL (ref 80.0–100.0)
MONO ABS: 0.5 10*3/uL (ref 0.2–0.9)
Monocytes Relative: 6 %
Neutro Abs: 7.2 10*3/uL — ABNORMAL HIGH (ref 1.4–6.5)
Neutrophils Relative %: 86 %
PLATELETS: 156 10*3/uL (ref 150–440)
RBC: 4.56 MIL/uL (ref 3.80–5.20)
RDW: 14.4 % (ref 11.5–14.5)
WBC: 8.3 10*3/uL (ref 3.6–11.0)

## 2015-12-04 LAB — LACTIC ACID, PLASMA: LACTIC ACID, VENOUS: 0.8 mmol/L (ref 0.5–2.0)

## 2015-12-04 MED ORDER — VANCOMYCIN HCL IN DEXTROSE 1-5 GM/200ML-% IV SOLN
1000.0000 mg | Freq: Once | INTRAVENOUS | Status: AC
  Administered 2015-12-04: 1000 mg via INTRAVENOUS
  Filled 2015-12-04: qty 200

## 2015-12-04 MED ORDER — MAGNESIUM SULFATE 4 GM/100ML IV SOLN
4.0000 g | Freq: Once | INTRAVENOUS | Status: AC
  Administered 2015-12-04: 4 g via INTRAVENOUS
  Filled 2015-12-04: qty 100

## 2015-12-04 MED ORDER — LEVOFLOXACIN IN D5W 750 MG/150ML IV SOLN
750.0000 mg | Freq: Once | INTRAVENOUS | Status: AC
  Administered 2015-12-05: 750 mg via INTRAVENOUS
  Filled 2015-12-04 (×2): qty 150

## 2015-12-04 MED ORDER — VANCOMYCIN HCL IN DEXTROSE 1-5 GM/200ML-% IV SOLN
1000.0000 mg | INTRAVENOUS | Status: DC
  Administered 2015-12-05 – 2015-12-08 (×3): 1000 mg via INTRAVENOUS
  Filled 2015-12-04 (×3): qty 200

## 2015-12-04 MED ORDER — DEXTROSE 5 % IV SOLN
2.0000 g | Freq: Once | INTRAVENOUS | Status: AC
  Administered 2015-12-04: 2 g via INTRAVENOUS
  Filled 2015-12-04: qty 2

## 2015-12-04 MED ORDER — SODIUM CHLORIDE 0.9 % IV SOLN
INTRAVENOUS | Status: DC
  Administered 2015-12-04: 10:00:00 via INTRAVENOUS
  Filled 2015-12-04: qty 1000

## 2015-12-04 MED ORDER — HEPARIN SOD (PORK) LOCK FLUSH 100 UNIT/ML IV SOLN
500.0000 [IU] | Freq: Once | INTRAVENOUS | Status: AC
  Administered 2015-12-04: 500 [IU] via INTRAVENOUS
  Filled 2015-12-04: qty 5

## 2015-12-04 MED ORDER — SODIUM CHLORIDE 0.9% FLUSH
10.0000 mL | INTRAVENOUS | Status: DC | PRN
  Administered 2015-12-04: 10 mL via INTRAVENOUS
  Filled 2015-12-04: qty 10

## 2015-12-04 MED ORDER — SODIUM CHLORIDE 0.9 % IV BOLUS (SEPSIS)
1000.0000 mL | INTRAVENOUS | Status: AC
  Administered 2015-12-04 (×2): 1000 mL via INTRAVENOUS

## 2015-12-04 NOTE — ED Provider Notes (Signed)
[facility] Emergency Department Provider Note    ____________________________________________  Time seen: On EMS arrival  I have reviewed the triage vital signs and the nursing notes.   HISTORY  Chief Complaint Weakness  History limited by: AMS   HPI Jacqueline Zamora is a 82 y.o. female with history of lung cancer status post chemotherapy and radiation therapy who presents to the emergency department today via EMS because of increased confusion. It sounds like this is occurred throughout the day. EMS did note that the patient was quite hypoxic on room air. They did put the patient on nasal cannula. Portion and the patient is not completely oriented and he can give a good history. She does however deny any chest pain, shortness of breath. She denies any recent nausea vomiting diarrhea or fevers.   Past Medical History  Diagnosis Date  . COPD (chronic obstructive pulmonary disease) ([place])   . Hypercalcemia   . Polycythemia   . Hyperlipidemia   . Vitamin D deficiency   . Vitamin B12 deficiency   . Pulmonary nodule   . Cancer ([place])     Small Cell Lung Cancer ([place])  . Pneumonia     Patient Active Problem List   Diagnosis Date Noted  . Chronic obstructive pulmonary disease ([place]) 07/15/2015  . Small cell carcinoma of lung ([place]) 07/15/2015  . CAFL (chronic airflow limitation) ([place]) 03/21/2015  . HLD (hyperlipidemia) 03/21/2015  . Polycythemia 03/21/2015  . SCC of lung (small cell carcinoma) ([place]) 03/21/2015  . B12 deficiency 03/21/2015  . Avitaminosis D 03/21/2015  . Compulsive tobacco user syndrome 02/12/2014  . Current tobacco use 02/12/2014  . Lung mass 01/23/2014  . Calcium blood increased 10/29/2002    Past Surgical History  Procedure Laterality Date  . Tonsillectomy    . Colon surgery      Sigmoid Colectomy  . Cataract extraction    . Eye surgery      Cataract Extraction 2008  . Colonoscopy with propofol N/A 05/02/2015    Procedure:  COLONOSCOPY WITH PROPOFOL;  Surgeon: Hulen Luster, MD;  Location[facility] ENDOSCOPY;  Service: Gastroenterology;  Laterality: N/A;  . Esophagogastroduodenoscopy (egd) with propofol N/A 05/02/2015    Procedure: ESOPHAGOGASTRODUODENOSCOPY (EGD) WITH PROPOFOL;  Surgeon: Hulen Luster, MD;  Location: [facility] ENDOSCOPY;  Service: Gastroenterology;  Laterality: N/A;    Current Outpatient Rx  Name  Route  Sig  Dispense  Refill  . ADVAIR DISKUS 250-50 MCG/DOSE AEPB                 Dispense as written.   Marland Kitchen EXPIRED: albuterol (PROAIR HFA) 108 (90 BASE) MCG/ACT inhaler   Inhalation   Inhale into the lungs.         Marland Kitchen albuterol (PROAIR HFA) 108 (90 Base) MCG/ACT inhaler   Inhalation   Inhale into the lungs.         . ALPRAZolam (XANAX) 0.25 MG tablet   Oral   Take 1 tablet (0.25 mg total) by mouth 2 (two) times daily as needed for anxiety.   60 tablet   1   . EXPIRED: azelastine (ASTELIN) 0.1 % nasal spray   Nasal   Place into the nose.         . Butenafine HCl 1 % cream   Topical   Apply topically.         . Cholecalciferol (D 2000) 2000 UNITS TABS   Oral   Take by mouth.         Marland Kitchen  Cyanocobalamin (RA VITAMIN B-12 TR) 1000 MCG TBCR   Oral   Take by mouth.         . diltiazem (DILTIAZEM CD) 120 MG 24 hr capsule   Oral   Take 120 mg by mouth daily.         . fluticasone (FLONASE) 50 MCG/ACT nasal spray   Nasal   Place into the nose.         Marland Kitchen Fluticasone-Salmeterol (ADVAIR) 500-50 MCG/DOSE AEPB   Inhalation   Inhale into the lungs.         Marland Kitchen HYDROcodone-acetaminophen (NORCO/VICODIN) 5-325 MG tablet      1/2 to 1 tablet Q 6hrs prn pain   30 tablet   0   . ipratropium-albuterol (DUONEB) 0.5-2.5 (3) MG/3ML SOLN               . omeprazole (PRILOSEC) 20 MG capsule   Oral   Take 1 capsule (20 mg total) by mouth 2 (two) times daily before a meal.   60 capsule   3   . predniSONE (DELTASONE) 5 MG tablet      TAKE TWO (2) TABLETS BY MOUTH DAILY WITHBREAKFAST    60 tablet   1     Allergies Neomycin-bacitracin zn-polymyx; Other; and Penicillins  No family history on file.  Social History Social History  Substance Use Topics  . Smoking status: Current Every Day Smoker -- 1.00 packs/day for 62 years    Types: Cigarettes  . Smokeless tobacco: Never Used  . Alcohol Use: No    Review of Systems  Constitutional: Negative for fever. Cardiovascular: Negative for chest pain. Respiratory: Negative for shortness of breath. Gastrointestinal: Negative for abdominal pain, vomiting and diarrhea. Neurological: Negative for headaches, focal weakness or numbness.  10-point ROS otherwise negative.  ____________________________________________   PHYSICAL EXAM:  VITAL SIGNS:    100.7  100   25  112/77 mmHg  95 %    Constitutional: Awake and alert. Not oriented.  Eyes: Conjunctivae are normal. PERRL. Normal extraocular movements. ENT   Head: Normocephalic and atraumatic.   Nose: No congestion/rhinnorhea.   Mouth/Throat: Mucous membranes are moist.   Neck: No stridor. Hematological/Lymphatic/Immunilogical: No cervical lymphadenopathy. Cardiovascular: Tachycardic, regular rhythm.  No murmurs, rubs, or gallops. Respiratory: Normal respiratory effort without tachypnea nor retractions. Some rhonchi in the right base. Gastrointestinal: Soft and nontender. No distention.  Genitourinary: Deferred Musculoskeletal: Normal range of motion in all extremities. No joint effusions.  No lower extremity tenderness nor edema. Neurologic:  Awake, alert, not oriented. Moving all extremities. Skin:  Skin is warm, dry and intact. No rash noted.  ____________________________________________    LABS (pertinent positives/negatives)  Labs Reviewed  COMPREHENSIVE METABOLIC PANEL - Abnormal; Notable for the following:    Sodium 134 (*)    Glucose, Bld 134 (*)    BUN 34 (*)    Creatinine, Ser 1.28 (*)    Calcium 8.1 (*)    Albumin 3.0 (*)    GFR  calc non Af Amer 38 (*)    GFR calc Af Amer 45 (*)    Anion gap 2 (*)    All other components within normal limits  TROPONIN I - Abnormal; Notable for the following:    Troponin I 0.05 (*)    All other components within normal limits  CBC WITH DIFFERENTIAL/PLATELET - Abnormal; Notable for the following:    Neutro Abs 7.2 (*)    Lymphs Abs 0.6 (*)    All other components within  normal limits  URINALYSIS COMPLETEWITH MICROSCOPIC ([facility] ONLY) - Abnormal; Notable for the following:    Color, Urine YELLOW (*)    APPearance CLOUDY (*)    Hgb urine dipstick 3+ (*)    Protein, ur 30 (*)    Leukocytes, UA TRACE (*)    Bacteria, UA RARE (*)    All other components within normal limits  CULTURE, BLOOD (ROUTINE X 2)  CULTURE, BLOOD (ROUTINE X 2)  URINE CULTURE  LACTIC ACID, PLASMA  LACTIC ACID, PLASMA  VANCOMYCIN, TROUGH     ____________________________________________   EKG  I, Nance Pear, attending physician, personally viewed and interpreted this EKG  EKG Time: 2148 Rate: 118 Rhythm: sinus tachycardia Axis: normal Intervals: qtc 421 QRS: narrow ST changes: no st elevation Impression: abnormal ekg   ____________________________________________    RADIOLOGY  CXR IMPRESSION: New opacity in the right lower lung is suspicious for acute infiltrate.  ____________________________________________   PROCEDURES  Procedure(s) performed: None  Critical Care performed: Yes, see critical care note(s)  CRITICAL CARE Performed by: Nance Pear   Total critical care time: 35 minutes  Critical care time was exclusive of separately billable procedures and treating other patients.  Critical care was necessary to treat or prevent imminent or life-threatening deterioration.  Critical care was time spent personally by me on the following activities: development of treatment plan with patient and/or surrogate as well as nursing, discussions with consultants,  evaluation of patient's response to treatment, examination of patient, obtaining history from patient or surrogate, ordering and performing treatments and interventions, ordering and review of laboratory studies, ordering and review of radiographic studies, pulse oximetry and re-evaluation of patient's condition.  ____________________________________________   INITIAL IMPRESSION / ASSESSMENT AND PLAN / ED COURSE  Pertinent labs & imaging results that were available during my care of the patient were reviewed by me and considered in my medical decision making (see chart for details).  Patient presented to the emergency department today because of concern from family for weakness and confusion. Patient was hypoxic on room air. Patient was febrile. Tachycardic. Tachypnea. CONCERNING for sepsis. Patient was started on IV fluid boluses and multiple broad-spectrum antibiotics. Chest x-ray concerning for pneumonia. Additionally urine had some white blood cells. Patient will be admitted to the hospitalist service.  ____________________________________________   FINAL CLINICAL IMPRESSION(S) / ED DIAGNOSES  Final diagnoses:  Community acquired pneumonia  Sepsis, due to unspecified organism [facility])  Hypoxia     Nance Pear, MD 12/04/15 2352

## 2015-12-04 NOTE — ED Notes (Signed)
Pt in via EMS from home; pt is a lung cancer pt who has recently completed chemo/radiation.  Pt family reports new onset weakness, confusion since Sunday, pt to oncology office today, family was advised to take her home and if she worsens to bring her in to ER.  Pt with worsening confusion, weakness since then.  EMS reports initial O2 sat of 62% on room air; pt placed on 4L nasal cannula with sats up to 95%.  O2 sat upon arrival to ED 76% on room air; pt placed on 4L nasal cannula to maintain sat greater than 92%.  Pt with no complaints at this time, pt appears in no distress.  Pt alert to self only.

## 2015-12-05 DIAGNOSIS — F17210 Nicotine dependence, cigarettes, uncomplicated: Secondary | ICD-10-CM | POA: Diagnosis present

## 2015-12-05 DIAGNOSIS — B961 Klebsiella pneumoniae [K. pneumoniae] as the cause of diseases classified elsewhere: Secondary | ICD-10-CM | POA: Diagnosis present

## 2015-12-05 DIAGNOSIS — R6520 Severe sepsis without septic shock: Secondary | ICD-10-CM | POA: Diagnosis present

## 2015-12-05 DIAGNOSIS — N390 Urinary tract infection, site not specified: Secondary | ICD-10-CM | POA: Diagnosis present

## 2015-12-05 DIAGNOSIS — J15211 Pneumonia due to Methicillin susceptible Staphylococcus aureus: Secondary | ICD-10-CM | POA: Diagnosis present

## 2015-12-05 DIAGNOSIS — Z880 Allergy status to penicillin: Secondary | ICD-10-CM | POA: Diagnosis not present

## 2015-12-05 DIAGNOSIS — Z923 Personal history of irradiation: Secondary | ICD-10-CM | POA: Diagnosis not present

## 2015-12-05 DIAGNOSIS — G3184 Mild cognitive impairment, so stated: Secondary | ICD-10-CM | POA: Diagnosis present

## 2015-12-05 DIAGNOSIS — J441 Chronic obstructive pulmonary disease with (acute) exacerbation: Secondary | ICD-10-CM | POA: Diagnosis present

## 2015-12-05 DIAGNOSIS — E559 Vitamin D deficiency, unspecified: Secondary | ICD-10-CM | POA: Diagnosis present

## 2015-12-05 DIAGNOSIS — Z85118 Personal history of other malignant neoplasm of bronchus and lung: Secondary | ICD-10-CM | POA: Diagnosis not present

## 2015-12-05 DIAGNOSIS — Z91040 Latex allergy status: Secondary | ICD-10-CM | POA: Diagnosis not present

## 2015-12-05 DIAGNOSIS — J9601 Acute respiratory failure with hypoxia: Secondary | ICD-10-CM | POA: Diagnosis present

## 2015-12-05 DIAGNOSIS — Z888 Allergy status to other drugs, medicaments and biological substances status: Secondary | ICD-10-CM | POA: Diagnosis not present

## 2015-12-05 DIAGNOSIS — Z808 Family history of malignant neoplasm of other organs or systems: Secondary | ICD-10-CM | POA: Diagnosis not present

## 2015-12-05 DIAGNOSIS — I5031 Acute diastolic (congestive) heart failure: Secondary | ICD-10-CM | POA: Diagnosis present

## 2015-12-05 DIAGNOSIS — A4101 Sepsis due to Methicillin susceptible Staphylococcus aureus: Secondary | ICD-10-CM | POA: Diagnosis present

## 2015-12-05 DIAGNOSIS — Z7952 Long term (current) use of systemic steroids: Secondary | ICD-10-CM | POA: Diagnosis not present

## 2015-12-05 DIAGNOSIS — F419 Anxiety disorder, unspecified: Secondary | ICD-10-CM | POA: Diagnosis present

## 2015-12-05 DIAGNOSIS — Z79899 Other long term (current) drug therapy: Secondary | ICD-10-CM | POA: Diagnosis not present

## 2015-12-05 DIAGNOSIS — Z803 Family history of malignant neoplasm of breast: Secondary | ICD-10-CM | POA: Diagnosis not present

## 2015-12-05 DIAGNOSIS — J189 Pneumonia, unspecified organism: Secondary | ICD-10-CM | POA: Diagnosis present

## 2015-12-05 DIAGNOSIS — E785 Hyperlipidemia, unspecified: Secondary | ICD-10-CM | POA: Diagnosis present

## 2015-12-05 DIAGNOSIS — J440 Chronic obstructive pulmonary disease with acute lower respiratory infection: Secondary | ICD-10-CM | POA: Diagnosis present

## 2015-12-05 DIAGNOSIS — R000 Tachycardia, unspecified: Secondary | ICD-10-CM | POA: Diagnosis present

## 2015-12-05 LAB — CBC
HCT: 40.1 % (ref 35.0–47.0)
HEMATOCRIT: 39.7 % (ref 35.0–47.0)
Hemoglobin: 13 g/dL (ref 12.0–16.0)
Hemoglobin: 13.2 g/dL (ref 12.0–16.0)
MCH: 30.3 pg (ref 26.0–34.0)
MCH: 30.5 pg (ref 26.0–34.0)
MCHC: 32.7 g/dL (ref 32.0–36.0)
MCHC: 32.9 g/dL (ref 32.0–36.0)
MCV: 92.5 fL (ref 80.0–100.0)
MCV: 92.7 fL (ref 80.0–100.0)
PLATELETS: 135 10*3/uL — AB (ref 150–440)
Platelets: 149 10*3/uL — ABNORMAL LOW (ref 150–440)
RBC: 4.29 MIL/uL (ref 3.80–5.20)
RBC: 4.32 MIL/uL (ref 3.80–5.20)
RDW: 14.2 % (ref 11.5–14.5)
RDW: 14.2 % (ref 11.5–14.5)
WBC: 7.7 10*3/uL (ref 3.6–11.0)
WBC: 9 10*3/uL (ref 3.6–11.0)

## 2015-12-05 LAB — MAGNESIUM: MAGNESIUM: 1.8 mg/dL (ref 1.7–2.4)

## 2015-12-05 LAB — CREATININE, SERUM
Creatinine, Ser: 1.23 mg/dL — ABNORMAL HIGH (ref 0.44–1.00)
GFR calc non Af Amer: 40 mL/min — ABNORMAL LOW (ref >60)
GFR, EST AFRICAN AMERICAN: 47 mL/min — AB (ref >60)

## 2015-12-05 LAB — BASIC METABOLIC PANEL
Anion gap: 3 — ABNORMAL LOW (ref 5–15)
BUN: 27 mg/dL — AB (ref 6–20)
CALCIUM: 7.7 mg/dL — AB (ref 8.9–10.3)
CO2: 28 mmol/L (ref 22–32)
CREATININE: 1.18 mg/dL — AB (ref 0.44–1.00)
Chloride: 103 mmol/L (ref 101–111)
GFR calc Af Amer: 49 mL/min — ABNORMAL LOW (ref >60)
GFR, EST NON AFRICAN AMERICAN: 42 mL/min — AB (ref >60)
GLUCOSE: 93 mg/dL (ref 65–99)
Potassium: 4.7 mmol/L (ref 3.5–5.1)
Sodium: 134 mmol/L — ABNORMAL LOW (ref 135–145)

## 2015-12-05 LAB — TROPONIN I
TROPONIN I: 0.04 ng/mL — AB (ref <0.031)
Troponin I: 0.07 ng/mL — ABNORMAL HIGH (ref <0.031)
Troponin I: 0.06 ng/mL — ABNORMAL HIGH (ref <0.031)

## 2015-12-05 MED ORDER — IPRATROPIUM-ALBUTEROL 0.5-2.5 (3) MG/3ML IN SOLN
3.0000 mL | RESPIRATORY_TRACT | Status: DC
Start: 2015-12-05 — End: 2015-12-06
  Administered 2015-12-05 – 2015-12-06 (×6): 3 mL via RESPIRATORY_TRACT
  Filled 2015-12-05 (×6): qty 3

## 2015-12-05 MED ORDER — SODIUM CHLORIDE 0.9% FLUSH
3.0000 mL | Freq: Two times a day (BID) | INTRAVENOUS | Status: DC
  Administered 2015-12-05 – 2015-12-11 (×14): 3 mL via INTRAVENOUS

## 2015-12-05 MED ORDER — ONDANSETRON HCL 4 MG/2ML IJ SOLN: 4.0000 mg | Freq: Four times a day (QID) | INTRAMUSCULAR | Status: DC | PRN

## 2015-12-05 MED ORDER — PANTOPRAZOLE SODIUM 40 MG PO TBEC
40.0000 mg | DELAYED_RELEASE_TABLET | Freq: Every day | ORAL | Status: DC
  Administered 2015-12-05 – 2015-12-11 (×7): 40 mg via ORAL
  Filled 2015-12-05 (×7): qty 1

## 2015-12-05 MED ORDER — ALPRAZOLAM 0.25 MG PO TABS
0.2500 mg | ORAL_TABLET | Freq: Two times a day (BID) | ORAL | Status: DC | PRN
  Administered 2015-12-06 – 2015-12-09 (×6): 0.25 mg via ORAL
  Filled 2015-12-05 (×6): qty 1

## 2015-12-05 MED ORDER — ACETAMINOPHEN 325 MG PO TABS: 650.0000 mg | ORAL_TABLET | Freq: Four times a day (QID) | ORAL | Status: DC | PRN

## 2015-12-05 MED ORDER — ENOXAPARIN SODIUM 40 MG/0.4ML ~~LOC~~ SOLN: 40.0000 mg | SUBCUTANEOUS | Status: DC

## 2015-12-05 MED ORDER — LEVOFLOXACIN IN D5W 750 MG/150ML IV SOLN
750.0000 mg | INTRAVENOUS | Status: DC
  Administered 2015-12-07: 10:00:00 750 mg via INTRAVENOUS
  Filled 2015-12-05: qty 150

## 2015-12-05 MED ORDER — IPRATROPIUM-ALBUTEROL 0.5-2.5 (3) MG/3ML IN SOLN: 3.0000 mL | RESPIRATORY_TRACT | Status: DC | PRN

## 2015-12-05 MED ORDER — HYDROCODONE-ACETAMINOPHEN 5-325 MG PO TABS: 0.5000 | ORAL_TABLET | Freq: Four times a day (QID) | ORAL | Status: DC | PRN

## 2015-12-05 MED ORDER — DEXTROSE 5 % IV SOLN
1.0000 g | Freq: Three times a day (TID) | INTRAVENOUS | Status: DC
  Filled 2015-12-05 (×2): qty 1

## 2015-12-05 MED ORDER — METHYLPREDNISOLONE SODIUM SUCC 125 MG IJ SOLR
60.0000 mg | Freq: Four times a day (QID) | INTRAMUSCULAR | Status: DC
  Administered 2015-12-05 – 2015-12-06 (×4): 60 mg via INTRAVENOUS
  Filled 2015-12-05 (×4): qty 2

## 2015-12-05 MED ORDER — ACETAMINOPHEN 650 MG RE SUPP: 650.0000 mg | Freq: Four times a day (QID) | RECTAL | Status: DC | PRN

## 2015-12-05 MED ORDER — MOMETASONE FURO-FORMOTEROL FUM 200-5 MCG/ACT IN AERO
2.0000 | INHALATION_SPRAY | Freq: Two times a day (BID) | RESPIRATORY_TRACT | Status: DC
  Administered 2015-12-05 – 2015-12-11 (×13): 2 via RESPIRATORY_TRACT
  Filled 2015-12-05 (×2): qty 8.8

## 2015-12-05 MED ORDER — SODIUM CHLORIDE 0.9 % IV SOLN
INTRAVENOUS | Status: AC
  Administered 2015-12-05: 02:00:00 via INTRAVENOUS

## 2015-12-05 MED ORDER — ENOXAPARIN SODIUM 30 MG/0.3ML ~~LOC~~ SOLN
30.0000 mg | SUBCUTANEOUS | Status: DC
  Administered 2015-12-05: 20:00:00 30 mg via SUBCUTANEOUS
  Filled 2015-12-05: qty 0.3

## 2015-12-05 MED ORDER — NICOTINE 14 MG/24HR TD PT24
14.0000 mg | MEDICATED_PATCH | Freq: Every day | TRANSDERMAL | Status: DC
  Administered 2015-12-05 – 2015-12-11 (×7): 14 mg via TRANSDERMAL
  Filled 2015-12-05 (×7): qty 1

## 2015-12-05 MED ORDER — ONDANSETRON HCL 4 MG PO TABS: 4.0000 mg | ORAL_TABLET | Freq: Four times a day (QID) | ORAL | Status: DC | PRN

## 2015-12-05 NOTE — Care Management (Signed)
Admitted to [facility] with the diagnosis of sepsis. Lives with daughter, Staci Righter, since January 2017 [phone]). Prior to living with daughter lived at [facility] x 14 years. Last seen Dr. Caryl Comes sometime this year. No home health. No skilled facility. No home oxygen. Uses a cane/rollator to aid in ambulation. Many falls. Decreased appetite.  Physical therapy evaluation completed. Recommends skilled nursing facility. Shelbie Ammons RN MSN CCM Care Management [phone]

## 2015-12-05 NOTE — Clinical Social Work Placement (Signed)
   CLINICAL SOCIAL WORK PLACEMENT  NOTE  Date:  12/05/2015  Patient Details  Name: Jacqueline Zamora MRN: [id number] Date of Birth: [birth year]  Clinical Social Work is seeking post-discharge placement for this patient at the [place] level of care (*CSW will initial, date and re-position this form in  chart as items are completed):  Yes   Patient/family provided with [facility] Work Department's list of facilities offering this level of care within the geographic area requested by the patient (or if unable, by the patient's family).  Yes   Patient/family informed of their freedom to choose among providers that offer the needed level of care, that participate in Medicare, Medicaid or managed care program needed by the patient, have an available bed and are willing to accept the patient.      Patient/family informed of [facility]'s ownership interest in [facility] and [facility], as well as of the fact that they are under no obligation to receive care at these facilities.  PASRR submitted to EDS on 12/05/15     PASRR number received on 12/05/15     Existing PASRR number confirmed on       FL2 transmitted to all facilities in geographic area requested by pt/family on 12/05/15     FL2 transmitted to all facilities within larger geographic area on       Patient informed that his/her managed care company has contracts with or will negotiate with certain facilities, including the following:            Patient/family informed of bed offers received.  Patient chooses bed at       Physician recommends and patient chooses bed at      Patient to be transferred to   on  .  Patient to be transferred to facility by       Patient family notified on   of transfer.  Name of family member notified:        PHYSICIAN       Additional Comment:    _______________________________________________ Darden Dates, LCSW 12/05/2015, 3:43 PM

## 2015-12-05 NOTE — NC FL2 (Signed)
[place] LEVEL OF CARE SCREENING TOOL     IDENTIFICATION  Patient Name: Jacqueline Zamora Birthdate: 01/03/1933 Sex: female Admission Date (Current Location): 12/04/2015  [place] and [place] Number:  Engineering geologist and Address:  [facility], [address] [place], [place] [address]      Provider Number: [id number]  Attending Physician Name and Address:  Epifanio Lesches, MD  Relative Name and Phone Number:       Current Level of Care: Hospital Recommended Level of Care: [place] Prior Approval Number:    Date Approved/Denied:   PASRR Number: [id number]  Discharge Plan: SNF    Current Diagnoses: Patient Active Problem List   Diagnosis Date Noted  . UTI (lower urinary tract infection) 12/05/2015  . Sepsis ([place]) 12/04/2015  . CAP (community acquired pneumonia) 12/04/2015  . Anxiety 12/04/2015  . COPD (chronic obstructive pulmonary disease) ([place]) 12/04/2015  . Chronic obstructive pulmonary disease ([place]) 07/15/2015  . Small cell carcinoma of lung ([place]) 07/15/2015  . CAFL (chronic airflow limitation) ([place]) 03/21/2015  . HLD (hyperlipidemia) 03/21/2015  . Polycythemia 03/21/2015  . SCC of lung (small cell carcinoma) ([place]) 03/21/2015  . B12 deficiency 03/21/2015  . Avitaminosis D 03/21/2015  . Compulsive tobacco user syndrome 02/12/2014  . Current tobacco use 02/12/2014  . Lung mass 01/23/2014  . Calcium blood increased 10/29/2002    Orientation RESPIRATION BLADDER Height & Weight     Self, Place, Situation  Normal Continent Weight: 127 lb (57.607 kg) Height:  '5\' 3"'$  (160 cm)  BEHAVIORAL SYMPTOMS/MOOD NEUROLOGICAL BOWEL NUTRITION STATUS      Continent Diet (Regular)  AMBULATORY STATUS COMMUNICATION OF NEEDS Skin   Limited Assist Verbally Normal                       Personal Care Assistance Level of Assistance  Bathing, Feeding, Dressing Bathing Assistance: Limited assistance Feeding  assistance: Independent Dressing Assistance: Limited assistance     Functional Limitations Info  Sight, Hearing, Speech Sight Info: Adequate Hearing Info: Adequate Speech Info: Adequate    SPECIAL CARE FACTORS FREQUENCY  PT (By licensed PT)     PT Frequency: 5              Contractures      Additional Factors Info  Code Status, Allergies Code Status Info: Full Code Allergies Info: Allergies: Iodine, Latex, Neomycin-bacitracin Zn-polymyx, Penicillins           Current Medications (12/05/2015):  This is the current hospital active medication list Current Facility-Administered Medications  Medication Dose Route Frequency Provider Last Rate Last Dose  . acetaminophen (TYLENOL) tablet 650 mg  650 mg Oral Q6H PRN Lance Coon, MD       Or  . acetaminophen (TYLENOL) suppository 650 mg  650 mg Rectal Q6H PRN Lance Coon, MD      . ALPRAZolam Duanne Moron) tablet 0.25 mg  0.25 mg Oral BID PRN Lance Coon, MD      . enoxaparin (LOVENOX) injection 30 mg  30 mg Subcutaneous Q24H Lance Coon, MD      . HYDROcodone-acetaminophen (NORCO/VICODIN) 5-325 MG per tablet 0.5-1 tablet  0.5-1 tablet Oral Q6H PRN Lance Coon, MD      . ipratropium-albuterol (DUONEB) 0.5-2.5 (3) MG/3ML nebulizer solution 3 mL  3 mL Nebulization Q4H Epifanio Lesches, MD   3 mL at 12/05/15 1219  . [START ON 12/07/2015] levofloxacin (LEVAQUIN) IVPB 750 mg  750 mg Intravenous Q48H Nance Pear,  MD      . methylPREDNISolone sodium succinate (SOLU-MEDROL) 125 mg/2 mL injection 60 mg  60 mg Intravenous Q6H Epifanio Lesches, MD   60 mg at 12/05/15 1150  . mometasone-formoterol (DULERA) 200-5 MCG/ACT inhaler 2 puff  2 puff Inhalation BID Lance Coon, MD   2 puff at 12/05/15 0800  . nicotine (NICODERM CQ - dosed in mg/24 hours) patch 14 mg  14 mg Transdermal Daily Lance Coon, MD   14 mg at 12/05/15 0939  . ondansetron (ZOFRAN) tablet 4 mg  4 mg Oral Q6H PRN Lance Coon, MD       Or  . ondansetron [facility])  injection 4 mg  4 mg Intravenous Q6H PRN Lance Coon, MD      . pantoprazole (PROTONIX) EC tablet 40 mg  40 mg Oral Daily Lance Coon, MD   40 mg at 12/05/15 0939  . sodium chloride flush (NS) 0.9 % injection 3 mL  3 mL Intravenous Q12H Lance Coon, MD   3 mL at 12/05/15 0940  . vancomycin (VANCOCIN) IVPB 1000 mg/200 mL premix  1,000 mg Intravenous Q36H Nance Pear, MD       Facility-Administered Medications Ordered in Other Encounters  Medication Dose Route Frequency Provider Last Rate Last Dose  . sodium chloride 0.9 % injection 10 mL  10 mL Intracatheter PRN Forest Gleason, MD   10 mL at 04/17/15 1339     Discharge Medications: Please see discharge summary for a list of discharge medications.  Relevant Imaging Results:  Relevant Lab Results:   Additional Information SSN:  [id number]  Darden Dates, LCSW

## 2015-12-05 NOTE — Plan of Care (Signed)
Problem: Physical Regulation: Goal: Signs and symptoms of infection will decrease Outcome: Progressing Pt received IV abx in the ED, fluid bolus. Remained afebrile this shift. Hand hygiene encouraged. Am labs drawn from right chest port-a-cath. Sputum sent to lab.

## 2015-12-05 NOTE — Evaluation (Signed)
Physical Therapy Evaluation Patient Details Name: Jacqueline Zamora MRN: [id number] DOB: 09/12/1932 Today's Date: 12/05/2015   History of Present Illness  Pt has been weak and having a cough over the last few weaks and has had many falls in the last few months.  She has a history of lung cancer and was apparently supposed to have O2, though she has never had it at home.    Clinical Impression  Pt with some general confusion and flat affect, unable to assess how this compares to baseline even with questioning daughter about this.  She apparently has been weak and unsteady for a while using a rollator.  She is able to slowly walk to/from the door with walker and 4 liters O2, but her sats drop to low 80s and she is fatigued and is not at all confident.  She would rather go home, but realizes that rehab may be a better option for her at this time.    Follow Up Recommendations SNF    Equipment Recommendations  3in1 (PT)    Recommendations for Other Services       Precautions / Restrictions Precautions Precautions: Fall Restrictions Weight Bearing Restrictions: No      Mobility  Bed Mobility Overal bed mobility: Needs Assistance Bed Mobility: Supine to Sit;Sit to Supine     Supine to sit: Min assist Sit to supine: Min assist   General bed mobility comments: Pt with some confusion and inability to do some mobility acts; needs assist  Transfers Overall transfer level: Needs assistance Equipment used: Rolling walker (2 wheeled) Transfers: Sit to/from Stand Sit to Stand: Mod assist;Min assist         General transfer comment: Pt is able to rise to standing with walker and some assist.  She lacks confidence with mobility.  Ambulation/Gait Ambulation/Gait assistance: Min assist Ambulation Distance (Feet): 25 Feet Assistive device: Rolling walker (2 wheeled)       General Gait Details: Pt with slow, cautious ambulation needing consistent cuing t/o.  She was on 4 liters O2  dropped from 92% to 82% and she was fatigued with the effort.   Stairs            Wheelchair Mobility    Modified Rankin (Stroke Patients Only)       Balance                                             Pertinent Vitals/Pain      Home Living Family/patient expects to be discharged to:: Private residence Living Arrangements: Children (was alone prior to 2 months ago)     Home Access: Stairs to enter Entrance Stairs-Rails: Right Entrance Stairs-Number of Steps:  (2)   Home Equipment: Walker - 4 wheels;Cane - single point      Prior Function Level of Independence: Needs assistance   Gait / Transfers Assistance Needed: Pt has been having a harder time with all mobility recently           Hand Dominance        Extremity/Trunk Assessment   Upper Extremity Assessment: Generalized weakness           Lower Extremity Assessment: Generalized weakness         Communication   Communication: No difficulties  Cognition Arousal/Alertness: Awake/alert Behavior During Therapy: Flat affect Overall Cognitive Status: Difficult to assess (She had some  confusion and was slow to answer questions)                      General Comments      Exercises        Assessment/Plan    PT Assessment Patient needs continued PT services  PT Diagnosis Generalized weakness;Difficulty walking   PT Problem List Decreased strength;Decreased balance;Decreased activity tolerance;Decreased mobility;Decreased safety awareness  PT Treatment Interventions DME instruction;Gait training;Stair training;Functional mobility training;Therapeutic activities;Therapeutic exercise;Balance training;Neuromuscular re-education;Cognitive remediation;Patient/family education   PT Goals (Current goals can be found in the Care Plan section) Acute Rehab PT Goals Patient Stated Goal: Pt states she'd rather go home than rehab PT Goal Formulation: With patient/family Time  For Goal Achievement: 12/19/15 Potential to Achieve Goals: Fair    Frequency Min 2X/week   Barriers to discharge        Co-evaluation               End of Session Equipment Utilized During Treatment: Gait belt;Oxygen (4 liters) Activity Tolerance: Patient limited by fatigue Patient left: with bed alarm set;with call bell/phone within reach;with family/visitor present Nurse Communication: Mobility status (O2 status)         Time: 1610-9604 PT Time Calculation (min) (ACUTE ONLY): 27 min   Charges:   PT Evaluation $PT Eval Moderate Complexity: 1 Procedure     PT G Codes:       Wayne Both, PT, DPT [phone]  Kreg Shropshire 12/05/2015, 11:24 AM

## 2015-12-05 NOTE — Progress Notes (Signed)
Initial Nutrition Assessment  DOCUMENTATION CODES:   Not applicable  INTERVENTION:   -Recommend liberalizing diet order for Regular to optimize nutritional intake -Cater to pt preferences. Will send biscuit with bacon as pt unable to eat breakfast -Pt reports not tolerating Ensure, therefore will recommend Magic Cup BID on lunch and dinner trays -Will send afternoon snack of cup of egg salad with crackers   NUTRITION DIAGNOSIS:   Inadequate oral intake related to poor appetite, acute illness as evidenced by per patient/family report.  GOAL:   Patient will meet greater than or equal to 90% of their needs  MONITOR:   PO intake, Supplement acceptance, Labs, Weight trends, I & O's  REASON FOR ASSESSMENT:   Malnutrition Screening Tool    ASSESSMENT:   Pt admitted with sepsis secondary to pna and likely UTI with h/o small cell carcinoma of the lung.  Past Medical History  Diagnosis Date  . COPD (chronic obstructive pulmonary disease) ([place])   . Hypercalcemia   . Polycythemia   . Hyperlipidemia   . Vitamin D deficiency   . Vitamin B12 deficiency   . Pulmonary nodule   . Cancer ([place])     Small Cell Lung Cancer ([place])  . Pneumonia     Diet Order:  Diet regular Room service appropriate?: Yes; Fluid consistency:: Thin    Current Nutrition: Pt reports not eating breakfast is the oatmeal was cold and she was busy with Nsg staff. Pt reports thinking she would like to have a biscuit with bacon if able.  Pt's daughter reports pt with decreased appetite for the past few days, eating hardly anything. Prior to that pt lives with other daughter and son-in-law and they fix her meals and make sure she eats a good amount. Pt reports usually having a good breakfast made by son-in-law of pancakes and oatmeal. Pt reports eating oatmeal cookies, snacks throughout the day and then eats whatever supper her family fixes.   Medications: reviewed, IVF noted  Continuous Medications:  . sodium  chloride Stopped (12/05/15 1152)    Labs: reviewed, Na 134  Gastrointestinal Profile: Last BM:  unknown, GI WDL    Nutrition-Focused Physical Exam Findings: Nutrition-Focused physical exam completed. Findings are no fat depletion, mild-moderate muscle depletion, and no edema.     Weight Change: Pt reports drastic weight loss over one year ago when going throught chemotherapy/radiation. Pt reports she has been weighing 123lbs recently. Daughter believes her weight has been stable recently. Per [facility] weight encounters possible weight gain even.   Skin:  Reviewed, no issues   Height:   Ht Readings from Last 1 Encounters:  12/04/15 '5\' 3"'$  (1.6 m)    Weight:   Wt Readings from Last 1 Encounters:  12/04/15 127 lb (57.607 kg)   Wt Readings from Last 10 Encounters:  12/04/15 127 lb (57.607 kg)  10/21/15 120 lb (54.432 kg)  09/15/15 114 lb 6.7 oz (51.9 kg)  08/18/15 113 lb 1.5 oz (51.3 kg)  07/15/15 117 lb 15.1 oz (53.5 kg)  05/20/15 118 lb 2 oz (53.581 kg)  05/02/15 120 lb (54.432 kg)  04/17/15 119 lb 2.5 oz (54.05 kg)  03/20/15 120 lb 5.9 oz (54.6 kg)    BMI:  Body mass index is 22.5 kg/(m^2).  Estimated Nutritional Needs:   Kcal:  1425-1710kcals  Protein:  46-57g protein  Fluid:  1.5-2.0L  EDUCATION NEEDS:   No education needs identified at this time  Dwyane Luo, RD, LDN Pager [phone] Weekend/On-Call Pager [phone]

## 2015-12-05 NOTE — Progress Notes (Signed)
[place] [facility] [place] NAME: Jacqueline Zamora    MR#:  [id number]  DATE OF BIRTH:  April 24, 1933  SUBJECTIVE: Admitted for generalized weakness and found to have community-acquired pneumonia  On  the right side. Patient still has lots of wheezing and short of breath and requiring 4 L of oxygen her saturations 93% only .  CHIEF COMPLAINT:   Chief Complaint  Patient presents with  . Altered Mental Status  . Weakness    REVIEW OF SYSTEMS:    Review of Systems  Constitutional: Positive for malaise/fatigue. Negative for fever and chills.  HENT: Negative for hearing loss.   Eyes: Negative for blurred vision, double vision and photophobia.  Respiratory: Positive for cough, sputum production, shortness of breath and wheezing. Negative for hemoptysis.   Cardiovascular: Negative for palpitations, orthopnea and leg swelling.  Gastrointestinal: Negative for vomiting, abdominal pain and diarrhea.  Genitourinary: Negative for dysuria and urgency.  Musculoskeletal: Negative for myalgias and neck pain.  Skin: Negative for rash.  Neurological: Positive for weakness. Negative for dizziness, focal weakness, seizures and headaches.  Psychiatric/Behavioral: Negative for memory loss. The patient does not have insomnia.     Nutrition:  Tolerating Diet: Tolerating PT:      DRUG ALLERGIES:   Allergies  Allergen Reactions  . Iodine Rash  . Latex Rash  . Neomycin-Bacitracin Zn-Polymyx Rash  . Penicillins Rash and Other (See Comments)    Family cannot answer follow-up questions    VITALS:  Blood pressure 101/58, pulse 102, temperature 97.6 F (36.4 C), temperature source Oral, resp. rate 20, height '5\' 3"'$  (1.6 m), weight 57.607 kg (127 lb), SpO2 93 %.  PHYSICAL EXAMINATION:   Physical Exam  GENERAL:  82 y.o.-year-old patient lying in the bed with no acute distress.  EYES: Pupils equal, round, reactive to light and accommodation. No scleral  icterus. Extraocular muscles intact.  HEENT: Head atraumatic, normocephalic. Oropharynx and nasopharynx clear.  NECK:  Supple, no jugular venous distention. No thyroid enlargement, no tenderness.  LUNGS: Expiratory wheeze in all lung fields.no,rhonchi or crepitation. No use of accessory muscles of respiration.  CARDIOVASCULAR: S1, S2 normal. No murmurs, rubs, or gallops.  ABDOMEN: Soft, nontender, nondistended. Bowel sounds present. No organomegaly or mass.  EXTREMITIES: No pedal edema, cyanosis, or clubbing.  NEUROLOGIC: Cranial nerves II through XII are intact. Muscle strength 5/5 in all extremities. Sensation intact. Gait not checked.  PSYCHIATRIC: The patient is alert and oriented x 3.  SKIN: No obvious rash, lesion, or ulcer.    LABORATORY PANEL:   CBC  Recent Labs Lab 12/05/15 0810  WBC 9.0  HGB 13.2  HCT 40.1  PLT 135*   ------------------------------------------------------------------------------------------------------------------  Chemistries   Recent Labs Lab 12/03/15 1042 12/04/15 2155  12/05/15 0810  NA 135 134*  --  134*  K 3.7 4.4  --  4.7  CL 99* 101  --  103  CO2 30 31  --  28  GLUCOSE 133* 134*  --  93  BUN 27* 34*  --  27*  CREATININE 1.34* 1.28*  < > 1.18*  CALCIUM 8.6* 8.1*  --  7.7*  MG 1.3*  --   --   --   AST 19 24  --   --   ALT 14 14  --   --   ALKPHOS 64 63  --   --   BILITOT 0.3 0.5  --   --   < > = values in this  interval not displayed. ------------------------------------------------------------------------------------------------------------------  Cardiac Enzymes  Recent Labs Lab 12/05/15 0810  TROPONINI 0.06*   ------------------------------------------------------------------------------------------------------------------  RADIOLOGY:  Dg Chest Port 1 View  12/04/2015  CLINICAL DATA:  Weakness, hypoxia and mental status changes. History of small cell lung carcinoma and COPD. EXAM: PORTABLE CHEST 1 VIEW COMPARISON:   07/15/2015 FINDINGS: Stable appearance of port a catheter the catheter tip in the lower SVC. Significant underlying lung disease again noted. There is additional new opacity at the right lung base suspicious for acute infiltrate and likely with the lower lobe. Scarring and atelectasis at the left lung base appears stable. No pneumothorax, edema or pleural fluid identified. The heart size and mediastinal contours are stable. IMPRESSION: New opacity in the right lower lung is suspicious for acute infiltrate. Electronically Signed   By: Aletta Edouard M.D.   On: 12/04/2015 22:11     ASSESSMENT AND PLAN:   Principal Problem:   Sepsis ([place]) Active Problems:   HLD (hyperlipidemia)   SCC of lung (small cell carcinoma) ([facility])   CAP (community acquired pneumonia)   Anxiety   COPD (chronic obstructive pulmonary disease) ([place])   UTI (lower urinary tract infection)   1 sepsis present on admission secondary to community-acquired pneumonia: Follow blood cultures. Continue IV hydration. 2;Acute respiratory failure with hypoxia secondary to COPD exacerbation and also pneumonia.; Continue oxygen to keep sats more than 95%, continue nebulizers with DuoNeb's every 4 hours, continue Levaquin, vancomycin. I have added IV steroids today for copd flare.pt sees DR.Fleming,will evaluate for home o2 need  After pneumonia and copd clears. #3 history of small cell lung cancer: Follows up at [place], #4 chronic hypomagnesemia;gets magnesium supplements in IV form at [place].; Received magnesium and IV yesterday at [place]. Recheck magnesium levels today. ##5 GI, DVT prophylaxis. Discussed with patient's daughter at bedside.     All the records are reviewed and case discussed with Care Management/Social Workerr. Management plans discussed with the patient, family and they are in agreement.  CODE STATUS: full  TOTAL TIME TAKING CARE OF THIS PATIENT: 35 minutes.   POSSIBLE D/C IN 1-3 DAYS,  DEPENDING ON CLINICAL CONDITION.   Epifanio Lesches M.D on 12/05/2015 at 11:29 AM  Between 7am to 6pm - Pager - [phone]  After 6pm go to [email or web address] - password [email or web address] [facility]  [facility]  Office  [phone]  CC: Primary care physician; Adin Hector, MD

## 2015-12-05 NOTE — Progress Notes (Signed)
Pharmacy Antibiotic Note  Jacqueline Zamora is a 82 y.o. female admitted on 12/04/2015 with sepsis.  Pharmacy has been consulted for vancomycin, aztreonam, and Levaquin dosing.  Plan: kri 0.028 hr-1  T1/2 25 hours DW 57.6kg  Vd 40L Vancomycin 1 gram q 36 hours ordered. Level before 4th dose. Goal trough 15-20.  Levaquin 750 mg IV q 48 hours ordered.  Aztreonam 1 gram q 8 hours ordered.  Height: '5\' 3"'$  (160 cm) Weight: 127 lb (57.607 kg) IBW/kg (Calculated) : 52.4  Temp (24hrs), Avg:100.7 F (38.2 C), Min:100.7 F (38.2 C), Max:100.7 F (38.2 C)   Recent Labs Lab 12/03/15 1042 12/04/15 2155  WBC 5.8 8.3  CREATININE 1.34* 1.28*  LATICACIDVEN  --  0.8    Estimated Creatinine Clearance: 29 mL/min (by C-G formula based on Cr of 1.28).    Allergies  Allergen Reactions  . Iodine Rash  . Latex Rash  . Neomycin-Bacitracin Zn-Polymyx Rash  . Penicillins Rash and Other (See Comments)    Family cannot answer follow-up questions    Antimicrobials this admission: vancomycin Levaquin aztreonam >>    >>   Dose adjustments this admission:   Microbiology results: 4/6 BCx: pending 4/6 UCx: pending   UA: LE(tr)  NO2(-) WBC 6-30 CXR: RLL opacity  Thank you for allowing pharmacy to be a part of this patient's care.  Faviola Klare S 12/05/2015 12:43 AM

## 2015-12-05 NOTE — Progress Notes (Signed)
This RN was notified by lab that the afternoon troponin and Magnesium blood work was not received despite the day shift RN sending. Labs drawn successfully from port, lab notified blood is on the way. Will watch for results.

## 2015-12-05 NOTE — H&P (Signed)
[place] [facility] [place] NAME: Jacqueline Zamora    MR#:  [id number]  DATE OF BIRTH:  1932/10/31  DATE OF ADMISSION:  12/04/2015  PRIMARY CARE PHYSICIAN: Adin Hector, MD   REQUESTING/REFERRING PHYSICIAN: Archie Balboa, MD  CHIEF COMPLAINT:   Chief Complaint  Patient presents with  . Altered Mental Status  . Weakness    HISTORY OF PRESENT ILLNESS:  Jacqueline Zamora  is a 82 y.o. female who presents with Progressive weakness, as well as significant cough or sputum production. Patient states that over the past couple of weeks her cough is gotten worse, and she is gotten progressively weaker. She has had some subjective fevers as well as some chills at home. She also states that she's had some urinary frequency, without overt dysuria. She was brought to the ED for evaluation as she has been too weak to really get up and move about her activities of daily living for the past couple of days. Here she was found to have a likely right lower lobe pneumonia on chest x-ray. Her white blood cell count is not technically elevated, though she does have some mild bandemia on differential. She also has a UA indeterminate, but potentially suspicious for UTI. She was febrile and tachycardic on presentation in the ED. Hospitals were called for admission  PAST MEDICAL HISTORY:   Past Medical History  Diagnosis Date  . COPD (chronic obstructive pulmonary disease) ([place])   . Hypercalcemia   . Polycythemia   . Hyperlipidemia   . Vitamin D deficiency   . Vitamin B12 deficiency   . Pulmonary nodule   . Cancer ([place])     Small Cell Lung Cancer ([facility])  . Pneumonia     PAST SURGICAL HISTORY:   Past Surgical History  Procedure Laterality Date  . Tonsillectomy    . Colon surgery      Sigmoid Colectomy  . Cataract extraction    . Eye surgery      Cataract Extraction 2008  . Colonoscopy with propofol N/A 05/02/2015    Procedure: COLONOSCOPY WITH PROPOFOL;  Surgeon: Hulen Luster, MD;  Location: [facility] ENDOSCOPY;  Service: Gastroenterology;  Laterality: N/A;  . Esophagogastroduodenoscopy (egd) with propofol N/A 05/02/2015    Procedure: ESOPHAGOGASTRODUODENOSCOPY (EGD) WITH PROPOFOL;  Surgeon: Hulen Luster, MD;  Location: [facility] ENDOSCOPY;  Service: Gastroenterology;  Laterality: N/A;    SOCIAL HISTORY:   Social History  Substance Use Topics  . Smoking status: Current Every Day Smoker -- 1.00 packs/day for 62 years    Types: Cigarettes  . Smokeless tobacco: Never Used  . Alcohol Use: No    FAMILY HISTORY:   Family History  Problem Relation Age of Onset  . Cancer Brother   . Breast cancer Daughter   . Thyroid cancer Daughter     DRUG ALLERGIES:   Allergies  Allergen Reactions  . Iodine Rash  . Latex Rash  . Neomycin-Bacitracin Zn-Polymyx Rash  . Penicillins Rash and Other (See Comments)    Family cannot answer follow-up questions    MEDICATIONS AT HOME:   Prior to Admission medications   Medication Sig Start Date End Date Taking? Authorizing Provider  albuterol (PROVENTIL HFA;VENTOLIN HFA) 108 (90 Base) MCG/ACT inhaler Inhale 2 puffs into the lungs every 6 (six) hours as needed for wheezing or shortness of breath.   Yes Historical Provider, MD  ALPRAZolam (XANAX) 0.25 MG tablet Take 1 tablet (0.25 mg total) by mouth 2 (two) times  daily as needed for anxiety. 09/15/15  Yes Forest Gleason, MD  azelastine (ASTELIN) 0.1 % nasal spray Place 1 spray into both nostrils 2 (two) times daily as needed for rhinitis or allergies. Use in each nostril as directed   Yes Historical Provider, MD  cholecalciferol (VITAMIN D) 1000 units tablet Take 1,000 Units by mouth daily.   Yes Historical Provider, MD  cyanocobalamin 500 MCG tablet Take 500 mcg by mouth daily.   Yes Historical Provider, MD  fluticasone (FLONASE) 50 MCG/ACT nasal spray Place 2 sprays into the nose daily as needed for allergies or rhinitis.  01/15/15 01/15/16 Yes Historical Provider, MD   Fluticasone-Salmeterol (ADVAIR DISKUS) 250-50 MCG/DOSE AEPB Inhale 1 puff into the lungs 2 (two) times daily.   Yes Historical Provider, MD  HYDROcodone-acetaminophen (NORCO/VICODIN) 5-325 MG tablet Take 0.5-1 tablets by mouth every 6 (six) hours as needed for moderate pain.   Yes Historical Provider, MD  omeprazole (PRILOSEC) 20 MG capsule Take 1 capsule (20 mg total) by mouth 2 (two) times daily before a meal. 08/18/15  Yes Forest Gleason, MD  predniSONE (DELTASONE) 5 MG tablet Take 10 mg by mouth daily with breakfast.   Yes Historical Provider, MD    REVIEW OF SYSTEMS:  Review of Systems  Constitutional: Positive for fever, chills and malaise/fatigue. Negative for weight loss.  HENT: Negative for ear pain, hearing loss and tinnitus.   Eyes: Negative for blurred vision, double vision, pain and redness.  Respiratory: Positive for cough and sputum production. Negative for hemoptysis and shortness of breath.   Cardiovascular: Negative for chest pain, palpitations, orthopnea and leg swelling.  Gastrointestinal: Negative for nausea, vomiting, abdominal pain, diarrhea and constipation.  Genitourinary: Positive for frequency. Negative for dysuria and hematuria.  Musculoskeletal: Negative for back pain, joint pain and neck pain.  Skin:       No acne, rash, or lesions  Neurological: Positive for weakness. Negative for dizziness, tremors and focal weakness.  Endo/Heme/Allergies: Negative for polydipsia. Does not bruise/bleed easily.  Psychiatric/Behavioral: Negative for depression. The patient is not nervous/anxious and does not have insomnia.      VITAL SIGNS:   Filed Vitals:   12/04/15 2315 12/04/15 2330 12/05/15 0000 12/05/15 0030  BP:  144/88 127/75 117/68  Pulse: 114 111 101 104  Temp:      TempSrc:      Resp: '21 24 28 26  '$ Height:      Weight:      SpO2: 93% 94% 94% 93%   Wt Readings from Last 3 Encounters:  12/04/15 57.607 kg (127 lb)  10/21/15 54.432 kg (120 lb)  09/15/15 51.9  kg (114 lb 6.7 oz)    PHYSICAL EXAMINATION:  Physical Exam  Vitals reviewed. Constitutional: She is oriented to person, place, and time. She appears well-developed and well-nourished. No distress.  HENT:  Head: Normocephalic and atraumatic.  Mouth/Throat: Oropharynx is clear and moist.  Eyes: Conjunctivae and EOM are normal. Pupils are equal, round, and reactive to light. No scleral icterus.  Neck: Normal range of motion. Neck supple. No JVD present. No thyromegaly present.  Cardiovascular: Regular rhythm and intact distal pulses.  Exam reveals no gallop and no friction rub.   No murmur heard. Tachycardic  Respiratory: Effort normal. No respiratory distress. She has wheezes (Left upper lobe). She has rales (Right lower lobe).  GI: Soft. Bowel sounds are normal. She exhibits no distension. There is no tenderness.  Musculoskeletal: Normal range of motion. She exhibits no edema.  No arthritis, no  gout  Lymphadenopathy:    She has no cervical adenopathy.  Neurological: She is alert and oriented to person, place, and time. No cranial nerve deficit.  No dysarthria, no aphasia  Skin: Skin is warm and dry. No rash noted. No erythema.  Psychiatric: She has a normal mood and affect. Her behavior is normal. Judgment and thought content normal.    LABORATORY PANEL:   CBC  Recent Labs Lab 12/04/15 2155  WBC 8.3  HGB 14.1  HCT 42.2  PLT 156   ------------------------------------------------------------------------------------------------------------------  Chemistries   Recent Labs Lab 12/03/15 1042 12/04/15 2155  NA 135 134*  K 3.7 4.4  CL 99* 101  CO2 30 31  GLUCOSE 133* 134*  BUN 27* 34*  CREATININE 1.34* 1.28*  CALCIUM 8.6* 8.1*  MG 1.3*  --   AST 19 24  ALT 14 14  ALKPHOS 64 63  BILITOT 0.3 0.5   ------------------------------------------------------------------------------------------------------------------  Cardiac Enzymes  Recent Labs Lab 12/04/15 2155   TROPONINI 0.05*   ------------------------------------------------------------------------------------------------------------------  RADIOLOGY:  Dg Chest Port 1 View  12/04/2015  CLINICAL DATA:  Weakness, hypoxia and mental status changes. History of small cell lung carcinoma and COPD. EXAM: PORTABLE CHEST 1 VIEW COMPARISON:  07/15/2015 FINDINGS: Stable appearance of port a catheter the catheter tip in the lower SVC. Significant underlying lung disease again noted. There is additional new opacity at the right lung base suspicious for acute infiltrate and likely with the lower lobe. Scarring and atelectasis at the left lung base appears stable. No pneumothorax, edema or pleural fluid identified. The heart size and mediastinal contours are stable. IMPRESSION: New opacity in the right lower lung is suspicious for acute infiltrate. Electronically Signed   By: Aletta Edouard M.D.   On: 12/04/2015 22:11    EKG:   Orders placed or performed in visit on 05/24/14  . EKG 12-Lead    IMPRESSION AND PLAN:  Principal Problem:   Sepsis ([place]) - broad spectrum antibiotics started and pancultures sent from the ED. Hemodynamically stable, lactic acid was within normal limits. We will continue antibiotics on admission and follow up culture results. Active Problems:   CAP (community acquired pneumonia) - antibiotics and cultures as above. We've added a sputum culture. This is likely source of her sepsis. Antitussive ordered, as well as when necessary nebs.   UTI (lower urinary tract infection) - antibiotics as above to cover both pulmonary and urinary source. Follow-up cultures.   SCC of lung (small cell carcinoma) (HCC) - not currently on chemotherapy. Per chart review she is followed by the oncology clinic here, and has a possible recurrence of her cancer after initial treatment, but is not pursuing further treatment at this time. She is in close follow-up with her oncologist.   Anxiety - continue home dose  anxiolytics   COPD (chronic obstructive pulmonary disease) ([place]) - continue home inhalers   HLD (hyperlipidemia) - continue home antilipid medication  All the records are reviewed and case discussed with ED provider. Management plans discussed with the patient and/or family.  DVT PROPHYLAXIS: SubQ lovenox  GI PROPHYLAXIS: PPI  ADMISSION STATUS: Inpatient  CODE STATUS: Full Code Status History    This patient does not have a recorded code status. Please follow your organizational policy for patients in this situation.    Advance Directive Documentation        Most Recent Value   Type of Advance Directive  Living will, Healthcare Power of Attorney   Pre-existing out of facility DNR order (  yellow form or pink MOST form)     "MOST" Form in Place?        TOTAL TIME TAKING CARE OF THIS PATIENT: 45 minutes.    Kaedance Magos [place] 12/05/2015, 12:45 AM  [facility]  Office  [phone]  CC: Primary care physician; Adin Hector, MD

## 2015-12-05 NOTE — Plan of Care (Signed)
Problem: Safety: Goal: Ability to remain free from injury will improve Outcome: Progressing Remains on IV Antibiotics.  Problem: Nutrition: Goal: Adequate nutrition will be maintained Outcome: Progressing Encouraged to eat meals.  Problem: Physical Regulation: Goal: Signs and symptoms of infection will decrease Outcome: Progressing WBC's within Defining limits. Remains on IV Antibiotics.

## 2015-12-05 NOTE — Clinical Social Work Note (Signed)
Clinical Social Work Assessment  Patient Details  Name: Jacqueline Zamora MRN: [id number] Date of Birth: 11/17/1932  Date of referral:  12/05/15               Reason for consult:  Facility Placement                Permission sought to share information with:  Family Supports Permission granted to share information::  Yes, Verbal Permission Granted  Name::     Staci Righter, daughter   Housing/Transportation Living arrangements for the past 2 months:  Single Family Home Source of Information:  Adult Children Patient Interpreter Needed:  None Criminal Activity/Legal Involvement Pertinent to Current Situation/Hospitalization:  No - Comment as needed Significant Relationships:  Adult Children Lives with:  Adult Children Do you feel safe going back to the place where you live?  Yes Need for family participation in patient care:  Yes (Comment)  Care giving concerns:  No care giving concerns identified.   Social Worker assessment / plan:  CSW met with pt's daughter to address consult. Pt was sleeping soundly. Per pt's daughter, pt has been confused. CSW introduced herself and explained role of social work. CSW also explained the process of discharging to SNF as recommended by PT. Pt lives with daughter. Pt's daughter will speak with her sister about SNF. Pt's daughter also asked about HHPT as she is able to provide 24 hour supervision. CSW initiated SNF search and will follow up with bed offers. CSW will continue to follow.   Employment status:  Retired Nurse, adult PT Recommendations:  [place] / Referral to community resources:  [place]  Patient/Family's Response to care:  Pt's daughter was Patent attorney of CSW support.   Patient/Family's Understanding of and Emotional Response to Diagnosis, Current Treatment, and Prognosis:  Pt's daughter would like the best for her mother, however it may be at home to be in pt's  familiar surroundings.    Emotional Assessment Appearance:  Appears stated age Attitude/Demeanor/Rapport:  Other (Sleeping soundly) Affect (typically observed):  Other Orientation:  Oriented to Self, Fluctuating Orientation (Suspected and/or reported Sundowners) Alcohol / Substance use:  Never Used Psych involvement (Current and /or in the community):  No (Comment)  Discharge Needs  Concerns to be addressed:  Adjustment to Illness Readmission within the last 30 days:  No Current discharge risk:  Chronically ill Barriers to Discharge:  No Barriers Identified   Darden Dates, LCSW 12/05/2015, 4:53 PM

## 2015-12-05 NOTE — Progress Notes (Signed)
Placed specimen cup at bedside for sputum sample. Pt instructed to provide a sample if one becomes available

## 2015-12-06 LAB — EXPECTORATED SPUTUM ASSESSMENT W REFEX TO RESP CULTURE

## 2015-12-06 LAB — URINE CULTURE: Culture: >=100000 — AB

## 2015-12-06 MED ORDER — NYSTATIN 100000 UNIT/ML MT SUSP
5.0000 mL | Freq: Four times a day (QID) | OROMUCOSAL | Status: DC
Start: 2015-12-06 — End: 2015-12-11
  Administered 2015-12-06 – 2015-12-11 (×18): 500000 [IU] via ORAL
  Filled 2015-12-06 (×18): qty 5

## 2015-12-06 MED ORDER — ENOXAPARIN SODIUM 40 MG/0.4ML ~~LOC~~ SOLN
40.0000 mg | SUBCUTANEOUS | Status: DC
  Administered 2015-12-06 – 2015-12-08 (×3): 40 mg via SUBCUTANEOUS
  Filled 2015-12-06 (×3): qty 0.4

## 2015-12-06 MED ORDER — METHYLPREDNISOLONE SODIUM SUCC 125 MG IJ SOLR
60.0000 mg | Freq: Two times a day (BID) | INTRAMUSCULAR | Status: DC
  Administered 2015-12-06 – 2015-12-09 (×6): 60 mg via INTRAVENOUS
  Filled 2015-12-06 (×6): qty 2

## 2015-12-06 MED ORDER — IPRATROPIUM-ALBUTEROL 0.5-2.5 (3) MG/3ML IN SOLN
3.0000 mL | Freq: Four times a day (QID) | RESPIRATORY_TRACT | Status: DC
  Administered 2015-12-06 – 2015-12-10 (×16): 3 mL via RESPIRATORY_TRACT
  Filled 2015-12-06 (×16): qty 3

## 2015-12-06 NOTE — Progress Notes (Signed)
Anticoagulation monitoring(Lovenox):  82 yo  ordered Lovenox 30 mg Q24h  Filed Weights   12/04/15 2242  Weight: 127 lb (57.607 kg)   Body mass index is 22.5 kg/(m^2).  Lab Results  Component Value Date   CREATININE 1.18* 12/05/2015   CREATININE 1.23* 12/05/2015   CREATININE 1.28* 12/04/2015   Estimated Creatinine Clearance: 31.5 mL/min (by C-G formula based on Cr of 1.18). Hemoglobin & Hematocrit     Component Value Date/Time   HGB 13.2 12/05/2015 0810   HGB 13.1 12/19/2014 1416   HCT 40.1 12/05/2015 0810   HCT 39.0 12/19/2014 1416     Per Protocol for Patient with estCrcl> 30 ml/min and BMI < 40, will transition to Lovenox 40 mg Q24h.

## 2015-12-06 NOTE — Plan of Care (Signed)
Problem: Physical Regulation: Goal: Signs and symptoms of infection will decrease Outcome: Progressing Remains on IV Antibiotics. WBC's WDL's.  Problem: Respiratory: Goal: Ability to maintain adequate ventilation will improve Outcome: Progressing Duonebs Q6hrs. Dulera Inhalers. Remains on IV Antibiotics. WBC's WDL's.

## 2015-12-06 NOTE — Progress Notes (Signed)
[place] [facility] [place] NAME: Jacqueline Zamora    MR#:  [id number]  DATE OF BIRTH:  02-24-1933  SUBJECTIVE:   CHIEF COMPLAINT:   Chief Complaint  Patient presents with  . Altered Mental Status  . Weakness   Seen for penumonia/UTI.  Still has SOB and cough. Yellow sputum On 4 L oxygen. Poor appetite.  REVIEW OF SYSTEMS:    Review of Systems  Constitutional: Positive for malaise/fatigue. Negative for fever and chills.  HENT: Negative for hearing loss.   Eyes: Negative for blurred vision, double vision and photophobia.  Respiratory: Positive for cough, sputum production, shortness of breath and wheezing. Negative for hemoptysis.   Cardiovascular: Negative for palpitations, orthopnea and leg swelling.  Gastrointestinal: Negative for vomiting, abdominal pain and diarrhea.  Genitourinary: Negative for dysuria and urgency.  Musculoskeletal: Negative for myalgias and neck pain.  Skin: Negative for rash.  Neurological: Positive for weakness. Negative for dizziness, focal weakness, seizures and headaches.  Psychiatric/Behavioral: Negative for memory loss. The patient does not have insomnia.     Nutrition:  Tolerating Diet: Tolerating PT:      DRUG ALLERGIES:   Allergies  Allergen Reactions  . Iodine Rash  . Latex Rash  . Neomycin-Bacitracin Zn-Polymyx Rash  . Penicillins Rash and Other (See Comments)    Family cannot answer follow-up questions    VITALS:  Blood pressure 106/57, pulse 105, temperature 97.4 F (36.3 C), temperature source Oral, resp. rate 18, height '5\' 3"'$  (1.6 m), weight 57.607 kg (127 lb), SpO2 96 %.  PHYSICAL EXAMINATION:   Physical Exam  GENERAL:  82 y.o.-year-old patient lying in the bed withconversational dyspnea  EYES: Pupils equal, round, reactive to light and accommodation. No scleral icterus. Extraocular muscles intact.  HEENT: Head atraumatic, normocephalic. Oropharynx and nasopharynx clear.   NECK:  Supple, no jugular venous distention. No thyroid enlargement, no tenderness.  LUNGS: Expiratory wheeze in all lung fields.  increased work of breathing CARDIOVASCULAR: S1, S2 normal. No murmurs, rubs, or gallops.  ABDOMEN: Soft, nontender, nondistended. Bowel sounds present. No organomegaly or mass.  EXTREMITIES: No pedal edema, cyanosis, or clubbing.  NEUROLOGIC: Cranial nerves II through XII are intact. Muscle strength 5/5 in all extremities. Sensation intact. Gait not checked.  PSYCHIATRIC: The patient is alert and oriented x 3.  SKIN: No obvious rash, lesion, or ulcer.    LABORATORY PANEL:   CBC  Recent Labs Lab 12/05/15 0810  WBC 9.0  HGB 13.2  HCT 40.1  PLT 135*   ------------------------------------------------------------------------------------------------------------------  Chemistries   Recent Labs Lab 12/04/15 2155  12/05/15 0810 12/05/15 2026  NA 134*  --  134*  --   K 4.4  --  4.7  --   CL 101  --  103  --   CO2 31  --  28  --   GLUCOSE 134*  --  93  --   BUN 34*  --  27*  --   CREATININE 1.28*  < > 1.18*  --   CALCIUM 8.1*  --  7.7*  --   MG  --   --   --  1.8  AST 24  --   --   --   ALT 14  --   --   --   ALKPHOS 63  --   --   --   BILITOT 0.5  --   --   --   < > = values in this interval not  displayed. ------------------------------------------------------------------------------------------------------------------  Cardiac Enzymes  Recent Labs Lab 12/05/15 2026  TROPONINI 0.04*   ------------------------------------------------------------------------------------------------------------------  RADIOLOGY:  Dg Chest Port 1 View  12/04/2015  CLINICAL DATA:  Weakness, hypoxia and mental status changes. History of small cell lung carcinoma and COPD. EXAM: PORTABLE CHEST 1 VIEW COMPARISON:  07/15/2015 FINDINGS: Stable appearance of port a catheter the catheter tip in the lower SVC. Significant underlying lung disease again noted. There is  additional new opacity at the right lung base suspicious for acute infiltrate and likely with the lower lobe. Scarring and atelectasis at the left lung base appears stable. No pneumothorax, edema or pleural fluid identified. The heart size and mediastinal contours are stable. IMPRESSION: New opacity in the right lower lung is suspicious for acute infiltrate. Electronically Signed   By: Aletta Edouard M.D.   On: 12/04/2015 22:11     ASSESSMENT AND PLAN:   Principal Problem:   Sepsis ([place]) Active Problems:   HLD (hyperlipidemia)   SCC of lung (small cell carcinoma) ([facility])   CAP (community acquired pneumonia)   Anxiety   COPD (chronic obstructive pulmonary disease) ([place])   UTI (lower urinary tract infection)   # Right sided commented acquired pneumonia with acute hypoxic respiratory failure and COPD exacerbation with sepsis - on admission  Still on 4 L oxygen and has significant shortness of breath. -IV steroids, Antibiotics - Scheduled Nebulizers - Inhalers -Wean O2 as tolerated - Consult pulmonary if no improvement - Sputum cultures have gram-positive cocci. Continue vancomycin along with Levaquin.   # gram-negative rod UTI On Levaquin. We'll wait for final culture results.  # history of small cell lung cancer Follows up at [place]  # chronic hypomagnesemia She periodically gets IV magnesium at the cancer center.  Discussed with patient and daughter at bedside.  All the records are reviewed and case discussed with Care Management/Social Workerr. Management plans discussed with the patient, family and they are in agreement.  CODE STATUS: FULL CODE  TOTAL TIME TAKING CARE OF THIS PATIENT: 35 minutes.   POSSIBLE D/C IN 2-3 DAYS, DEPENDING ON CLINICAL CONDITION.   Hillary Bow R M.D on 12/06/2015 at 11:38 AM  Between 7am to 6pm - Pager - [phone]  After 6pm go to [email or web address] - password [email or web address] [facility]  [facility]  Office   [phone]  CC: Primary care physician; Adin Hector, MD

## 2015-12-07 LAB — CREATININE, SERUM
CREATININE: 1.23 mg/dL — AB (ref 0.44–1.00)
GFR calc non Af Amer: 40 mL/min — ABNORMAL LOW (ref >60)
GFR, EST AFRICAN AMERICAN: 47 mL/min — AB (ref >60)

## 2015-12-07 MED ORDER — SALINE SPRAY 0.65 % NA SOLN
1.0000 | NASAL | Status: DC | PRN
  Administered 2015-12-07: 1 via NASAL
  Filled 2015-12-07: qty 44

## 2015-12-07 MED ORDER — SODIUM CHLORIDE 0.9% FLUSH: 10.0000 mL | INTRAVENOUS | Status: DC | PRN

## 2015-12-07 NOTE — Progress Notes (Signed)
Community acquired pmeumonia,still having difficulty breating and requiring 4 liters of oxygen via nasal canula,continues on iv antibiotics and steriods,had a bm today.

## 2015-12-07 NOTE — Progress Notes (Signed)
[facility] [place] NAME: Jacqueline Zamora    MR#:  [id number]  DATE OF BIRTH:  [birth year]  SUBJECTIVE:   CHIEF COMPLAINT:   Chief Complaint  Patient presents with  . Altered Mental Status  . Weakness   Seen for penumonia/UTI.  Continues to have shortness of breath and cough. Less sputum. Oxygen saturations dropping to the 80s with minimal ambulation. Good appetite. Daughter at bedside.  REVIEW OF SYSTEMS:    Review of Systems  Constitutional: Positive for malaise/fatigue. Negative for fever and chills.  HENT: Negative for hearing loss.   Eyes: Negative for blurred vision, double vision and photophobia.  Respiratory: Positive for cough, sputum production, shortness of breath and wheezing. Negative for hemoptysis.   Cardiovascular: Negative for palpitations, orthopnea and leg swelling.  Gastrointestinal: Negative for vomiting, abdominal pain and diarrhea.  Genitourinary: Negative for dysuria and urgency.  Musculoskeletal: Negative for myalgias and neck pain.  Skin: Negative for rash.  Neurological: Positive for weakness. Negative for dizziness, focal weakness, seizures and headaches.  Psychiatric/Behavioral: Negative for memory loss. The patient does not have insomnia.     DRUG ALLERGIES:   Allergies  Allergen Reactions  . Iodine Rash  . Latex Rash  . Neomycin-Bacitracin Zn-Polymyx Rash  . Penicillins Rash and Other (See Comments)    Family cannot answer follow-up questions    VITALS:  Blood pressure 117/67, pulse 117, temperature 98.4 F (36.9 C), temperature source Oral, resp. rate 20, height '5\' 3"'$  (1.6 m), weight 57.607 kg (127 lb), SpO2 91 %.  PHYSICAL EXAMINATION:   Physical Exam  GENERAL:  82 y.o.-year-old patient lying in the bed withconversational dyspnea  EYES: Pupils equal, round, reactive to light and accommodation. No scleral icterus. Extraocular muscles intact.  HEENT: Head atraumatic, normocephalic.  Oropharynx and nasopharynx clear.  NECK:  Supple, no jugular venous distention. No thyroid enlargement, no tenderness.  LUNGS: Expiratory wheeze in all lung fields.  increased work of breathing CARDIOVASCULAR: S1, S2 normal. No murmurs, rubs, or gallops.  ABDOMEN: Soft, nontender, nondistended. Bowel sounds present. No organomegaly or mass.  EXTREMITIES: No pedal edema, cyanosis, or clubbing.  NEUROLOGIC: Cranial nerves II through XII are intact. Muscle strength 5/5 in all extremities. Sensation intact. Gait not checked.  PSYCHIATRIC: The patient is alert and oriented x 3.  SKIN: No obvious rash, lesion, or ulcer.    LABORATORY PANEL:   CBC  Recent Labs Lab 12/05/15 0810  WBC 9.0  HGB 13.2  HCT 40.1  PLT 135*   ------------------------------------------------------------------------------------------------------------------  Chemistries   Recent Labs Lab 12/04/15 2155  12/05/15 0810 12/05/15 2026 12/07/15 0602  NA 134*  --  134*  --   --   K 4.4  --  4.7  --   --   CL 101  --  103  --   --   CO2 31  --  28  --   --   GLUCOSE 134*  --  93  --   --   BUN 34*  --  27*  --   --   CREATININE 1.28*  < > 1.18*  --  1.23*  CALCIUM 8.1*  --  7.7*  --   --   MG  --   --   --  1.8  --   AST 24  --   --   --   --   ALT 14  --   --   --   --  ALKPHOS 63  --   --   --   --   BILITOT 0.5  --   --   --   --   < > = values in this interval not displayed. ------------------------------------------------------------------------------------------------------------------  Cardiac Enzymes  Recent Labs Lab 12/05/15 2026  TROPONINI 0.04*   ------------------------------------------------------------------------------------------------------------------  RADIOLOGY:  No results found.   ASSESSMENT AND PLAN:   Principal Problem:   Sepsis ([place]) Active Problems:   HLD (hyperlipidemia)   SCC of lung (small cell carcinoma) (HCC)   CAP (community acquired pneumonia)   Anxiety    COPD (chronic obstructive pulmonary disease) ([place])   UTI (lower urinary tract infection)   # Right sided commented acquired pneumonia with acute hypoxic respiratory failure and COPD exacerbation with sepsis -  present on admission  Still on 4 L oxygen and has significant shortness of breath. -IV steroids, Antibiotics. Reduce steroid dose  - Scheduled Nebulizers - Inhalers -Wean O2 as tolerated - Sputum cultures have gram-positive cocci. Continue vancomycin along with Levaquin.  waiting for final results   # Klebsiella UTI  On Levaquin.   # history of small cell lung cancer Follows up at [facility]  # chronic hypomagnesemia She periodically gets IV magnesium at the cancer center.  # Tobacco use Counseled to quit smoke  Discussed with patient and daughter at bedside.  All the records are reviewed and case discussed with Care Management/Social Workerr. Management plans discussed with the patient, family and they are in agreement.  CODE STATUS: FULL CODE  TOTAL TIME TAKING CARE OF THIS PATIENT: 35 minutes.   POSSIBLE D/C IN 2-3 DAYS, DEPENDING ON CLINICAL CONDITION.  Hillary Bow R M.D on 12/07/2015 at 11:49 AM  Between 7am to 6pm - Pager - [phone]  After 6pm go to [email or web address] - password [email or web address] [facility]  [facility] [facility]  Office  [phone]  CC: Primary care physician; Adin Hector, MD

## 2015-12-07 NOTE — Progress Notes (Signed)
Pharmacy Antibiotic Note  Jacqueline Zamora is a 82 y.o. female admitted on 12/04/2015 with sepsis/PNA/UTI.  Pharmacy has been consulted for vancomycin, and Levaquin dosing.  Plan: kri 0.028 hr-1  T1/2 25 hours DW 57.6kg  Vd 40L Vancomycin 1 gram q 36 hours ordered. Level before 4th dose. Goal trough 15-20.  Levaquin 750 mg IV q 48 hours ordered.  Height: '5\' 3"'$  (160 cm) Weight: 127 lb (57.607 kg) IBW/kg (Calculated) : 52.4  Temp (24hrs), Avg:98.1 F (36.7 C), Min:97.4 F (36.3 C), Max:98.4 F (36.9 C)   Recent Labs Lab 12/03/15 1042 12/04/15 2155 12/05/15 0245 12/05/15 0810 12/07/15 0602  WBC 5.8 8.3 7.7 9.0  --   CREATININE 1.34* 1.28* 1.23* 1.18* 1.23*  LATICACIDVEN  --  0.8  --   --   --     Estimated Creatinine Clearance: 30.2 mL/min (by C-G formula based on Cr of 1.23).    Allergies  Allergen Reactions  . Iodine Rash  . Latex Rash  . Neomycin-Bacitracin Zn-Polymyx Rash  . Penicillins Rash and Other (See Comments)    Family cannot answer follow-up questions    Antimicrobials this admission: vancomycin 4/6 Levaquin   Dose adjustments this admission:   Microbiology results: 4/6 BCx: pending 4/6 [id number] pneumoniae- (Ampicillin, nitrofurantoin resistant )  4/7 sputum cx: -Mod Staph Aureus- sens. Pending                          -Gram + rods   UA: LE(tr)  NO2(-) WBC 6-30 CXR: RLL opacity  Thank you for allowing pharmacy to be a part of this patient's care.  Raja Caputi A 12/07/2015 2:47 PM

## 2015-12-07 NOTE — Progress Notes (Signed)
Pt c/o sinus congestion and dryneess, requesting saline nasal spray. Order entered by this RN per standing admission order by Dr. Jannifer Franklin.

## 2015-12-08 ENCOUNTER — Inpatient Hospital Stay: Payer: Medicare Other

## 2015-12-08 ENCOUNTER — Inpatient Hospital Stay
Admit: 2015-12-08 | Discharge: 2015-12-08 | Disposition: A | Discharge status: Home / Self Care | Payer: Medicare Other | Attending: Internal Medicine | Admitting: Internal Medicine

## 2015-12-08 LAB — CULTURE, RESPIRATORY

## 2015-12-08 LAB — ECHOCARDIOGRAM COMPLETE
HEIGHTINCHES: 63 in
WEIGHTICAEL: 2032 [oz_av]

## 2015-12-08 LAB — CULTURE, RESPIRATORY W GRAM STAIN

## 2015-12-08 LAB — BRAIN NATRIURETIC PEPTIDE: B NATRIURETIC PEPTIDE 5: 533 pg/mL — AB (ref 0.0–100.0)

## 2015-12-08 MED ORDER — ENSURE ENLIVE PO LIQD
237.0000 mL | Freq: Two times a day (BID) | ORAL | Status: DC
  Administered 2015-12-08 – 2015-12-11 (×6): 237 mL via ORAL

## 2015-12-08 MED ORDER — FUROSEMIDE 10 MG/ML IJ SOLN
40.0000 mg | Freq: Once | INTRAMUSCULAR | Status: AC
  Administered 2015-12-08: 40 mg via INTRAVENOUS
  Filled 2015-12-08: qty 4

## 2015-12-08 MED ORDER — METOPROLOL SUCCINATE ER 25 MG PO TB24
12.5000 mg | ORAL_TABLET | Freq: Every day | ORAL | Status: DC
  Administered 2015-12-08 – 2015-12-10 (×3): 12.5 mg via ORAL
  Filled 2015-12-08 (×3): qty 1

## 2015-12-08 MED ORDER — FUROSEMIDE 10 MG/ML IJ SOLN
20.0000 mg | Freq: Two times a day (BID) | INTRAMUSCULAR | Status: DC
  Administered 2015-12-08 – 2015-12-10 (×4): 20 mg via INTRAVENOUS
  Filled 2015-12-08 (×4): qty 2

## 2015-12-08 MED ORDER — CEPHALEXIN 250 MG PO CAPS
500.0000 mg | ORAL_CAPSULE | Freq: Three times a day (TID) | ORAL | Status: DC
  Administered 2015-12-08 – 2015-12-09 (×3): 500 mg via ORAL
  Filled 2015-12-08 (×3): qty 2

## 2015-12-08 NOTE — Progress Notes (Signed)
Physical Therapy Treatment Patient Details Name: Jacqueline Zamora MRN: [id number] DOB: 03/26/1933 Today's Date: 12/08/2015    History of Present Illness Pt has been weak and having a cough over the last few weaks and has had many falls in the last few months.  She has a history of lung cancer and was apparently supposed to have O2, though she has never had it at home.      PT Comments    Initial attempt for PT, pt with respiratory therapy. Second attempt, pt agreeable to PT although reports not feeling well in general with fatigue, general malaise, weakness and pain in upper abdomen. Pt O2 saturation on 4 liters 86% initially in supine with heart rate in the 120's. Pt assisted with sitting edge of bed and encouraged deep pursed lip breathing. Pt with difficulty maintaining upright sitting posture with left lean and forward slump. Pt able to correct with cueing and Min guard to Min A, but unable to attain due to feeling extremely weak and tired. Pt performs a few edge of bed exercises with encouragement. O2 saturation does not improve; heart rate fluctuates between 120 and 131 beats per minute. Deferred stand/ambulation due to poor sitting tolerance and vitals. Nursing assistant notified. Pt wishes return back to bed with assist. Continue PT to progress strength and endurance to improve all functional mobility.   Follow Up Recommendations  SNF     Equipment Recommendations  3in1 (PT)    Recommendations for Other Services       Precautions / Restrictions Precautions Precautions: Fall Restrictions Weight Bearing Restrictions: No    Mobility  Bed Mobility Overal bed mobility: Needs Assistance Bed Mobility: Supine to Sit;Sit to Supine     Supine to sit: Min assist Sit to supine: Min assist   General bed mobility comments: Min A for trunk. Slow to transfer  Transfers                 General transfer comment: Not tested; pt with difficulty maintaining uprigth sitting  posture  Ambulation/Gait             General Gait Details: Unable today   Stairs            Wheelchair Mobility    Modified Rankin (Stroke Patients Only)       Balance Overall balance assessment: Needs assistance Sitting-balance support: Bilateral upper extremity supported;Feet supported Sitting balance-Leahy Scale: Poor   Postural control: Left lateral lean;Other (comment) (forward slump)                          Cognition Arousal/Alertness: Awake/alert Behavior During Therapy: WFL for tasks assessed/performed Overall Cognitive Status: Within Functional Limits for tasks assessed                      Exercises General Exercises - Lower Extremity Long Arc Quad: AROM;Both;10 reps;Seated Hip ABduction/ADduction: AROM;Both;10 reps;Seated Hip Flexion/Marching: AROM;Both;15 reps;Seated Toe Raises: AROM;Both;15 reps;Seated Heel Raises: AROM;Both;15 reps;Seated    General Comments        Pertinent Vitals/Pain      Home Living                      Prior Function            PT Goals (current goals can now be found in the care plan section) Progress towards PT goals: Not progressing toward goals - comment    Frequency  Min 2X/week    PT Plan Current plan remains appropriate    Co-evaluation             End of Session Equipment Utilized During Treatment: Oxygen Activity Tolerance: Patient limited by fatigue (limited by weakness; low O2 saturation) Patient left: in bed;with call bell/phone within reach;with bed alarm set     Time: 1523-1540 PT Time Calculation (min) (ACUTE ONLY): 17 min  Charges:  $Therapeutic Exercise: 8-22 mins                    G Codes:      Charlaine Dalton, PTA 12/08/2015, 3:49 PM

## 2015-12-08 NOTE — Plan of Care (Signed)
Problem: Nutrition: Goal: Adequate nutrition will be maintained Outcome: Not Progressing Enc to eat more  Ensure started  Problem: Respiratory: Goal: Ability to maintain adequate ventilation will improve Outcome: Progressing Started lasix iv today after cxr. Small pleural effusion noted

## 2015-12-08 NOTE — Care Management Important Message (Signed)
Important Message  Patient Details  Name: Jacqueline Zamora MRN: [id number] Date of Birth: 17-May-1933   Medicare Important Message Given:  Yes    Juliann Pulse A Aimee Heldman 12/08/2015, 1:56 PM

## 2015-12-08 NOTE — Progress Notes (Signed)
Pharmacy Antibiotic Note  Jacqueline Zamora is a 81 y.o. female admitted on 12/04/2015 with sepsis/PNA/UTI.  Pharmacy has been consulted for vancomycin, and Levaquin dosing.  Plan: kri 0.028 hr-1  T1/2 25 hours DW 57.6kg  Vd 40L Vancomycin 1 gram q 36 hours ordered. Level before 4th dose. Goal trough 15-20.  Levaquin 750 mg IV q 48 hours ordered.  4/10 - vancomycin trough ordered for 17:30, not drawn, dose hung. Will cancel trough and reschedule trough for next dose.   Height: '5\' 3"'$  (160 cm) Weight: 127 lb (57.607 kg) IBW/kg (Calculated) : 52.4  Temp (24hrs), Avg:97.8 F (36.6 C), Min:97.6 F (36.4 C), Max:98 F (36.7 C)   Recent Labs Lab 12/03/15 1042 12/04/15 2155 12/05/15 0245 12/05/15 0810 12/07/15 0602  WBC 5.8 8.3 7.7 9.0  --   CREATININE 1.34* 1.28* 1.23* 1.18* 1.23*  LATICACIDVEN  --  0.8  --   --   --     Estimated Creatinine Clearance: 30.2 mL/min (by C-G formula based on Cr of 1.23).    Allergies  Allergen Reactions  . Iodine Rash  . Latex Rash  . Neomycin-Bacitracin Zn-Polymyx Rash  . Penicillins Rash and Other (See Comments)    Family cannot answer follow-up questions    Antimicrobials this admission: vancomycin 4/6 Levaquin   Dose adjustments this admission:   Microbiology results: 4/6 BCx: pending 4/6 [id number] pneumoniae- (Ampicillin, nitrofurantoin resistant )  4/7 sputum cx: -Mod Staph Aureus- sens. Pending                          -Gram + rods   UA: LE(tr)  NO2(-) WBC 6-30 CXR: RLL opacity  Thank you for allowing pharmacy to be a part of this patient's care.  Humberto Addo C 12/08/2015 7:13 PM

## 2015-12-08 NOTE — Progress Notes (Signed)
*  PRELIMINARY RESULTS* Echocardiogram 2D Echocardiogram has been performed.  Jacqueline Zamora 12/08/2015, 3:30 PM

## 2015-12-08 NOTE — Progress Notes (Signed)
[facility] at [place] NAME: Jacqueline Zamora    MR#:  [id number]  DATE OF BIRTH:  [birth year]  SUBJECTIVE:   CHIEF COMPLAINT:   Chief Complaint  Patient presents with  . Altered Mental Status  . Weakness   Seen for penumonia/UTI.  Continues to have shortness of breath and cough. On 4 L oxygen Weakness. Patient using bedside commode. Saturations dropped with minimal movement. Daughter at bedside. As per daughter patient has had mild short-term memory problems for the past few months. This is worse recently. Has no diagnosis of dementia.  REVIEW OF SYSTEMS:    Review of Systems  Constitutional: Positive for malaise/fatigue. Negative for fever and chills.  HENT: Negative for hearing loss.   Eyes: Negative for blurred vision, double vision and photophobia.  Respiratory: Positive for cough, sputum production, shortness of breath and wheezing. Negative for hemoptysis.   Cardiovascular: Negative for palpitations, orthopnea and leg swelling.  Gastrointestinal: Negative for vomiting, abdominal pain and diarrhea.  Genitourinary: Negative for dysuria and urgency.  Musculoskeletal: Negative for myalgias and neck pain.  Skin: Negative for rash.  Neurological: Positive for weakness. Negative for dizziness, focal weakness, seizures and headaches.  Psychiatric/Behavioral: Negative for memory loss. The patient does not have insomnia.     DRUG ALLERGIES:   Allergies  Allergen Reactions  . Iodine Rash  . Latex Rash  . Neomycin-Bacitracin Zn-Polymyx Rash  . Penicillins Rash and Other (See Comments)    Family cannot answer follow-up questions    VITALS:  Blood pressure 141/78, pulse 122, temperature 98 F (36.7 C), temperature source Oral, resp. rate 22, height '5\' 3"'$  (1.6 m), weight 57.607 kg (127 lb), SpO2 92 %.  PHYSICAL EXAMINATION:   Physical Exam  GENERAL:  82 y.o.-year-old patient lying in the bed with conversational dyspnea   EYES: Pupils equal, round, reactive to light and accommodation. No scleral icterus. Extraocular muscles intact.  HEENT: Head atraumatic, normocephalic. Oropharynx and nasopharynx clear.  NECK:  Supple, no jugular venous distention. No thyroid enlargement, no tenderness.  LUNGS: Expiratory wheeze in all lung fields.  increased work of breathing. Decreased breath sounds right base CARDIOVASCULAR: S1, S2 normal. No murmurs, rubs, or gallops.  ABDOMEN: Soft, nontender, nondistended. Bowel sounds present. No organomegaly or mass.  EXTREMITIES: No pedal edema, cyanosis, or clubbing.  NEUROLOGIC: Cranial nerves II through XII are intact. Muscle strength 5/5 in all extremities. Sensation intact. Gait not checked.  PSYCHIATRIC: The patient is alert and awake. SKIN: No obvious rash, lesion, or ulcer.    LABORATORY PANEL:   CBC  Recent Labs Lab 12/05/15 0810  WBC 9.0  HGB 13.2  HCT 40.1  PLT 135*   ------------------------------------------------------------------------------------------------------------------  Chemistries   Recent Labs Lab 12/04/15 2155  12/05/15 0810 12/05/15 2026 12/07/15 0602  NA 134*  --  134*  --   --   K 4.4  --  4.7  --   --   CL 101  --  103  --   --   CO2 31  --  28  --   --   GLUCOSE 134*  --  93  --   --   BUN 34*  --  27*  --   --   CREATININE 1.28*  < > 1.18*  --  1.23*  CALCIUM 8.1*  --  7.7*  --   --   MG  --   --   --  1.8  --   AST  24  --   --   --   --   ALT 14  --   --   --   --   ALKPHOS 63  --   --   --   --   BILITOT 0.5  --   --   --   --   < > = values in this interval not displayed. ------------------------------------------------------------------------------------------------------------------  Cardiac Enzymes  Recent Labs Lab 12/05/15 2026  TROPONINI 0.04*   ------------------------------------------------------------------------------------------------------------------  RADIOLOGY:  No results found.   ASSESSMENT  AND PLAN:   Principal Problem:   Sepsis ([place]) Active Problems:   HLD (hyperlipidemia)   SCC of lung (small cell carcinoma) (HCC)   CAP (community acquired pneumonia)   Anxiety   COPD (chronic obstructive pulmonary disease) ([place])   UTI (lower urinary tract infection)   # Right sided community acquired pneumonia with acute hypoxic respiratory failure and COPD exacerbation with sepsis -  present on admission  Continues to have significant shortness of breath. On 4 L oxygen. -IV steroids, Antibiotics. - Scheduled Nebulizers -Wean O2 as tolerated Staph aureus in sputum cultures. On IV vancomycin and Levaquin. Patient is still acutely ill on 4-5 L oxygen. No improvement. Repeat chest x-ray to rule out pleural effusion and CHF.  # Klebsiella UTI  On Levaquin.   # history of small cell lung cancer Follows up at [place]  # chronic hypomagnesemia She periodically gets IV magnesium at the cancer center.  # Mild cognitive impairment Outpatient follow-up  # Tobacco use Counseled to quit smoke  Discussed with patient and daughter at bedside.  All the records are reviewed and case discussed with Care Management/Social Workerr. Management plans discussed with the patient, family and they are in agreement.  CODE STATUS: FULL CODE  TOTAL TIME TAKING CARE OF THIS PATIENT: 35 minutes.   POSSIBLE D/C IN 2-3 DAYS, DEPENDING ON CLINICAL CONDITION.  Hillary Bow R M.D on 12/08/2015 at 10:26 AM  Between 7am to 6pm - Pager - [phone]  After 6pm go to [email or web address] - password [email or web address] [facility]  [place] [facility]  Office  [phone]  CC: Primary care physician; Adin Hector, MD

## 2015-12-09 LAB — CBC WITH DIFFERENTIAL/PLATELET
BASOS PCT: 0 %
Basophils Absolute: 0 10*3/uL (ref 0–0.1)
EOS ABS: 0 10*3/uL (ref 0–0.7)
Eosinophils Relative: 0 %
HCT: 43.1 % (ref 35.0–47.0)
HEMOGLOBIN: 14.4 g/dL (ref 12.0–16.0)
LYMPHS ABS: 0.3 10*3/uL — AB (ref 1.0–3.6)
Lymphocytes Relative: 5 %
MCH: 30.9 pg (ref 26.0–34.0)
MCHC: 33.4 g/dL (ref 32.0–36.0)
MCV: 92.5 fL (ref 80.0–100.0)
Monocytes Absolute: 0.1 10*3/uL — ABNORMAL LOW (ref 0.2–0.9)
Monocytes Relative: 2 %
Neutro Abs: 4.7 10*3/uL (ref 1.4–6.5)
Neutrophils Relative %: 93 %
Platelets: 147 10*3/uL — ABNORMAL LOW (ref 150–440)
RBC: 4.66 MIL/uL (ref 3.80–5.20)
RDW: 14.2 % (ref 11.5–14.5)
WBC: 5.1 10*3/uL (ref 3.6–11.0)

## 2015-12-09 LAB — BASIC METABOLIC PANEL
ANION GAP: 9 (ref 5–15)
BUN: 46 mg/dL — ABNORMAL HIGH (ref 6–20)
CHLORIDE: 97 mmol/L — AB (ref 101–111)
CO2: 34 mmol/L — ABNORMAL HIGH (ref 22–32)
Calcium: 9 mg/dL (ref 8.9–10.3)
Creatinine, Ser: 1.32 mg/dL — ABNORMAL HIGH (ref 0.44–1.00)
GFR calc non Af Amer: 37 mL/min — ABNORMAL LOW (ref >60)
GFR, EST AFRICAN AMERICAN: 43 mL/min — AB (ref >60)
Glucose, Bld: 188 mg/dL — ABNORMAL HIGH (ref 65–99)
POTASSIUM: 4.3 mmol/L (ref 3.5–5.1)
SODIUM: 140 mmol/L (ref 135–145)

## 2015-12-09 MED ORDER — ENOXAPARIN SODIUM 30 MG/0.3ML ~~LOC~~ SOLN
30.0000 mg | SUBCUTANEOUS | Status: DC
  Administered 2015-12-09 – 2015-12-10 (×2): 30 mg via SUBCUTANEOUS
  Filled 2015-12-09 (×2): qty 0.3

## 2015-12-09 MED ORDER — METHYLPREDNISOLONE SODIUM SUCC 40 MG IJ SOLR
40.0000 mg | Freq: Two times a day (BID) | INTRAMUSCULAR | Status: DC
Start: 2015-12-09 — End: 2015-12-10
  Administered 2015-12-09 – 2015-12-10 (×2): 40 mg via INTRAVENOUS
  Filled 2015-12-09 (×2): qty 1

## 2015-12-09 MED ORDER — LEVOFLOXACIN IN D5W 750 MG/150ML IV SOLN
750.0000 mg | INTRAVENOUS | Status: DC
  Administered 2015-12-09: 750 mg via INTRAVENOUS
  Filled 2015-12-09: qty 150

## 2015-12-09 NOTE — Progress Notes (Signed)
Date: 12/09/2015,   MRN# [id number] Jacqueline Zamora 1933-06-14 Code Status:     Code Status Orders        Start     Ordered   12/05/15 0214  Full code   Continuous     12/05/15 0214    Code Status History    Date Active Date Inactive Code Status Order ID Comments User Context   This patient has a current code status but no historical code status.    Advance Directive Documentation        Most Recent Value   Type of Advance Directive  Healthcare Power of Attorney   Pre-existing out of facility DNR order (yellow form or pink MOST form)     "MOST" Form in Place?       Hosp day:'@LENGTHOFSTAYDAYS'$ @ Referring MD: '@ATDPROV'$ @       AdmissionWeight: 127 lb (57.607 kg)                 CurrentWeight: 127 lb (57.607 kg)  CC: increase sob and hypoxia   HPI: This is an 82 year old lady, history of copd, stage III, smoker, s/p xrt for  Small cell lung cancer. She is needing to be on 4-5 liters oxygen. Asked to see to evaluate for the hypoxia.  She is not having frank pleurisy, hemoptysis, calf pain, edema, bleeding. The chest xray is noted below. See hpi for details  PMHX:   Past Medical History  Diagnosis Date  . COPD (chronic obstructive pulmonary disease) ([place])   . Hypercalcemia   . Polycythemia   . Hyperlipidemia   . Vitamin D deficiency   . Vitamin B12 deficiency   . Pulmonary nodule   . Cancer ([place])     Small Cell Lung Cancer (RT)  . Pneumonia    Surgical Hx:  Past Surgical History  Procedure Laterality Date  . Tonsillectomy    . Colon surgery      Sigmoid Colectomy  . Cataract extraction    . Eye surgery      Cataract Extraction 2008  . Colonoscopy with propofol N/A 05/02/2015    Procedure: COLONOSCOPY WITH PROPOFOL;  Surgeon: Hulen Luster, MD;  Location: [facility] ENDOSCOPY;  Service: Gastroenterology;  Laterality: N/A;  . Esophagogastroduodenoscopy (egd) with propofol N/A 05/02/2015    Procedure: ESOPHAGOGASTRODUODENOSCOPY (EGD) WITH PROPOFOL;  Surgeon: Hulen Luster, MD;  Location:  [facility] ENDOSCOPY;  Service: Gastroenterology;  Laterality: N/A;   Family Hx:  Family History  Problem Relation Age of Onset  . Cancer Brother   . Breast cancer Daughter   . Thyroid cancer Daughter    Social Hx:   Social History  Substance Use Topics  . Smoking status: Current Every Day Smoker -- 1.00 packs/day for 62 years    Types: Cigarettes  . Smokeless tobacco: Never Used  . Alcohol Use: No   Medication:    Home Medication:  No current outpatient prescriptions on file.  Current Medication: '@CURMEDTAB'$ @   Allergies:  Iodine; Latex; Neomycin-bacitracin zn-polymyx; and Penicillins  Review of Systems: Gen:  Denies  fever, sweats, chills HEENT: Denies blurred vision, double vision, ear pain, eye pain, hearing loss, nose bleeds, sore throat Cvc:  No dizziness, chest pain or heaviness Resp:  Dyspnea, vague chest pain on the right, no hemoptysis, rare wheezing, mild cough  Gi: Denies swallowing difficulty, stomach pain, nausea or vomiting, diarrhea, constipation, bowel incontinence Gu:  Denies bladder incontinence, burning urine Ext:   No Joint pain, stiffness or swelling Skin: No  skin rash, easy bruising or bleeding or hives Endoc:  No polyuria, polydipsia , polyphagia or weight change Psych: No depression, insomnia or hallucinations  Other:  All other systems negative  Physical Examination:   VS: BP 129/81 mmHg  Pulse 117  Temp(Src) 97.5 F (36.4 C) (Oral)  Resp 20  Ht '5\' 3"'$  (1.6 m)  Wt 127 lb (57.607 kg)  BMI 22.50 kg/m2  SpO2 95%  General Appearance: No distress, thin  Neuro/psych without focal findings, mental status, speech normal, alert and oriented, cranial nerves 2-12 intact, reflexes normal and symmetric, sensation grossly normal  HEENT: PERRLA, EOM intact, no ptosis, no other lesions noticed: NECK: Supple no stridor Pulmonary: distant rare,  wheezing, No rales   Cardiovascular:  Normal S1,S2.  No m/r/g.  .    Abdomen:Benign, Soft, non-tender, No masses,  hepatosplenomegaly, No lymphadenopathy Endoc: No evident thyromegaly, no signs of acromegaly or Cushing features Skin:   warm, no rashes, no ecchymosis  Extremities: normal, no cyanosis, clubbing, no edema, warm with normal capillary refill.    Labs results:   Recent Labs     12/07/15  0602  12/09/15  0456  HGB   --   14.4  HCT   --   43.1  MCV   --   92.5  WBC   --   5.1  BUN   --   46*  CREATININE  1.23*  1.32*  GLUCOSE   --   188*  CALCIUM   --   9.0  ,  SPIROMETRY: FVC was 2.17 liters, 88% of predicted FEV1 was 1.22, 68% of predicted FEV1 ratio was 56 FEF 25-75% liters per second was 34% of predicted  FLOW VOLUME LOOP: Delayed expiratory flow volume loop   Rad results:  EXAM: CHEST 2 VIEW  COMPARISON: 12/04/2015 .  FINDINGS: Port-A-Cath noted with tip projected over the superior vena cava in stable position. Mediastinum and hilar structures are stable. Cardiomegaly with with mild pulmonary vascular prominence and basilar interstitial prominence with small pleural effusions suggesting a mild component congestive heart failure. Persistent right middle lobe atelectasis and/or mass lesion. No pneumothorax. No acute bony abnormality.  IMPRESSION: 1. Port-A-Cath in stable position.  2. Persistent right middle lobe atelectasis and/or mass lesion.  3. Cardiomegaly with pulmonary venous congestion, basilar interstitial prominence, small pleural effusions suggesting mild congestive heart failure.   Electronically Signed  By: Marcello Moores Register    Assessment and Plan: . Chronic airway obstruction, stage II copd, certainly part of the differential for her hypoxia, s/p xrt but chest not impressive for radiation pneumonitis. There is mild pulmonary edema, diuresis is in progress.    . Small cell lung cancer, right, hopefully in remission, following with oncology    Plan: -continue Dulera, duo neb, solumedrol  -cautious diuresis -incentive  spirometry -wean o2  As tolerated -oncology follow up -stay off cigarettes -DVT prophylaxis -if hypoxia persist consider ct angio for pe -following     I have personally obtained a history, examined the patient, evaluated laboratory and imaging results, formulated the assessment and plan and placed orders.  The Patient requires high complexity decision making for assessment and support, frequent evaluation and titration of therapies, application of advanced monitoring technologies and extensive interpretation of multiple databases.   Herbon Fleming,M.D. Pulmonary & Critical care Medicine [facility]

## 2015-12-09 NOTE — Progress Notes (Signed)
PT Cancellation Note  Patient Details Name: METTA KORANDA MRN: [id number] DOB: [birth year]   Cancelled Treatment:    Reason Eval/Treat Not Completed: Patient declined, no reason specified. Treatment attempted this morning. Pt refuses PT at this time due to general malaise and fatigue. Re attempt treatment at a later time/date as the schedule allows.    Charlaine Dalton, [place] 12/09/2015, 11:50 AM

## 2015-12-09 NOTE — Plan of Care (Signed)
Problem: Safety: Goal: Ability to remain free from injury will improve Outcome: Progressing Pt calls  For assist to be oob. Alert. 02 5 l [place].  For  Pulmonary consult.  Problem: Nutrition: Goal: Adequate nutrition will be maintained Outcome: Not Progressing Pt still not eating  A lot  With her trays . Drinking ensure   Problem: Respiratory: Goal: Ability to maintain adequate ventilation will improve Outcome: Progressing See above note

## 2015-12-09 NOTE — Progress Notes (Signed)
Pharmacy Antibiotic Note  Jacqueline Zamora is a 82 y.o. female admitted on 12/04/2015 with sepsis/PNA/UTI.  Pharmacy has been consulted for Levaquin dosing.  Plan: Levofloxacin 750 mg IV Q48H  Height: '5\' 3"'$  (160 cm) Weight: 127 lb (57.607 kg) IBW/kg (Calculated) : 52.4  Temp (24hrs), Avg:97.7 F (36.5 C), Min:97.5 F (36.4 C), Max:98 F (36.7 C)   Recent Labs Lab 12/03/15 1042 12/04/15 2155 12/05/15 0245 12/05/15 0810 12/07/15 0602 12/09/15 0456  WBC 5.8 8.3 7.7 9.0  --  5.1  CREATININE 1.34* 1.28* 1.23* 1.18* 1.23* 1.32*  LATICACIDVEN  --  0.8  --   --   --   --     Estimated Creatinine Clearance: 28.1 mL/min (by C-G formula based on Cr of 1.32).    Allergies  Allergen Reactions  . Iodine Rash  . Latex Rash  . Neomycin-Bacitracin Zn-Polymyx Rash  . Penicillins Rash and Other (See Comments)    Family cannot answer follow-up questions    Antimicrobials this admission: vancomycin 4/6 Levaquin   Dose adjustments this admission:   Microbiology results: 4/6 BCx: pending 4/6 [id number] pneumoniae- (Ampicillin, nitrofurantoin resistant )  4/7 sputum cx: -Mod Staph Aureus- sens. Pending                          -Gram + rods   UA: LE(tr)  NO2(-) WBC 6-30 CXR: RLL opacity  Thank you for allowing pharmacy to be a part of this patient's care.  Laural Benes, Pharm.D., BCPS Clinical Pharmacist 12/09/2015 7:17 AM

## 2015-12-09 NOTE — Progress Notes (Signed)
[place] [facility] [place] NAME: Jacqueline Zamora    MR#:  [id number]  DATE OF BIRTH:  1933/04/10  SUBJECTIVE:   Patient still with SOB this am  REVIEW OF SYSTEMS:    Review of Systems  Constitutional: Positive for malaise/fatigue. Negative for fever and chills.  HENT: Negative for hearing loss.   Eyes: Negative for blurred vision, double vision and photophobia.  Respiratory: Positive for cough, sputum production and shortness of breath. Negative for hemoptysis.   Cardiovascular: Negative for palpitations, orthopnea and leg swelling.  Gastrointestinal: Negative for vomiting, abdominal pain and diarrhea.  Genitourinary: Negative for dysuria and urgency.  Musculoskeletal: Negative for myalgias and neck pain.  Skin: Negative for rash.  Neurological: Positive for weakness. Negative for dizziness, focal weakness, seizures and headaches.  Psychiatric/Behavioral: Negative for memory loss. The patient does not have insomnia.     DRUG ALLERGIES:   Allergies  Allergen Reactions  . Iodine Rash  . Latex Rash  . Neomycin-Bacitracin Zn-Polymyx Rash  . Penicillins Rash and Other (See Comments)    Family cannot answer follow-up questions    VITALS:  Blood pressure 120/79, pulse 111, temperature 97.6 F (36.4 C), temperature source Oral, resp. rate 20, height '5\' 3"'$  (1.6 m), weight 57.607 kg (127 lb), SpO2 94 %.  PHYSICAL EXAMINATION:   Physical Exam  Constitutional: She is oriented to person, place, and time and well-developed, well-nourished, and in no distress. No distress.  HENT:  Head: Normocephalic.  Eyes: No scleral icterus.  Neck: Normal range of motion. Neck supple. No JVD present. No tracheal deviation present.  Cardiovascular: Normal rate, regular rhythm and normal heart sounds.  Exam reveals no gallop and no friction rub.   No murmur heard. Pulmonary/Chest: Effort normal. No respiratory distress. She has wheezes. She has no rales.  She exhibits no tenderness.  Decreased at bases rhonchi,wheezing  Abdominal: Soft. Bowel sounds are normal. She exhibits no distension and no mass. There is no tenderness. There is no rebound and no guarding.  Musculoskeletal: Normal range of motion. She exhibits no edema.  Neurological: She is alert and oriented to person, place, and time.  Skin: Skin is warm. No rash noted. No erythema.  Psychiatric: Affect and judgment normal.      LABORATORY PANEL:   CBC  Recent Labs Lab 12/09/15 0456  WBC 5.1  HGB 14.4  HCT 43.1  PLT 147*   ------------------------------------------------------------------------------------------------------------------  Chemistries   Recent Labs Lab 12/04/15 2155  12/05/15 2026  12/09/15 0456  NA 134*  < >  --   --  140  K 4.4  < >  --   --  4.3  CL 101  < >  --   --  97*  CO2 31  < >  --   --  34*  GLUCOSE 134*  < >  --   --  188*  BUN 34*  < >  --   --  46*  CREATININE 1.28*  < >  --   < > 1.32*  CALCIUM 8.1*  < >  --   --  9.0  MG  --   --  1.8  --   --   AST 24  --   --   --   --   ALT 14  --   --   --   --   ALKPHOS 63  --   --   --   --   BILITOT  0.5  --   --   --   --   < > = values in this interval not displayed. ------------------------------------------------------------------------------------------------------------------  Cardiac Enzymes  Recent Labs Lab 12/05/15 2026  TROPONINI 0.04*   ------------------------------------------------------------------------------------------------------------------  RADIOLOGY:  Dg Chest 2 View  12/08/2015  CLINICAL DATA:  Progressive weakness.  Follow-up. EXAM: CHEST  2 VIEW COMPARISON:  12/04/2015 . FINDINGS: Port-A-Cath noted with tip projected over the superior vena cava in stable position. Mediastinum and hilar structures are stable. Cardiomegaly with with mild pulmonary vascular prominence and basilar interstitial prominence with small pleural effusions suggesting a mild component  congestive heart failure. Persistent right middle lobe atelectasis and/or mass lesion. No pneumothorax. No acute bony abnormality. IMPRESSION: 1.  Port-A-Cath in stable position. 2.  Persistent right middle lobe atelectasis and/or mass lesion. 3. Cardiomegaly with pulmonary venous congestion, basilar interstitial prominence, small pleural effusions suggesting mild congestive heart failure. Electronically Signed   By: Marcello Moores  Register   On: 12/08/2015 11:19     ASSESSMENT AND PLAN:  82 year old female with a history of COPD  and small cell lung cancer who presents with generalized weakness and found to have pneumonia.  1.Staph aureus Right sided community acquired pneumonia with acute hypoxic respiratory failure and COPD exacerbation with sepsis and acute diastolic congestive heart failure: Discontinue vancomycin and continue Levaquin for pneumonia and Klebsiella UTI.  Pulmonary consult as patient is not showing much improvement.  Continue IV Lasix for diastolic heart failure, acute.  Continue IV steroids.  Continue inhalers and DuoNeb's.  '@ISS'$ .   2. Klebsiella UTI   Continue withn Levaquin.   3. history of small cell lung cancer Follows up at [facility]  4 chronic hypomagnesemia She periodically gets IV magnesium at the cancer center.  5. Mild cognitive impairment Outpatient follow-up  6. Tobacco use  continue nicotine patch  Discussed with patient   7. Acute diastolic heart failure: Echo shows normal ejection fraction with mild concentric LVH Continue IV Lasix and monitoring of creatinine.   management plans discussed with patient.  Patient will need skilled nursing facility at discharge.  CODE STATUS: FULL CODE  TOTAL TIME TAKING CARE OF THIS PATIENT: 28 minutes.   POSSIBLE D/C IN 2-3 DAYS, DEPENDING ON CLINICAL CONDITION.  Kirt Chew M.D on 12/09/2015 at 11:19 AM  Between 7am to 6pm - Pager - [phone]  After 6pm go to [email or web address] - password [email or web address] [facility]  [place] [facility]  Office  [phone]  CC: Primary care physician; Adin Hector, MD

## 2015-12-09 NOTE — Progress Notes (Signed)
Anticoagulation monitoring(Lovenox):  82 yo F ordered Lovenox 40 mg Q24h  Filed Weights   12/04/15 2242  Weight: 127 lb (57.607 kg)   BMI 22.5  Lab Results  Component Value Date   CREATININE 1.32* 12/09/2015   CREATININE 1.23* 12/07/2015   CREATININE 1.18* 12/05/2015   Estimated Creatinine Clearance: 28.1 mL/min (by C-G formula based on Cr of 1.32). Hemoglobin & Hematocrit     Component Value Date/Time   HGB 14.4 12/09/2015 0456   HGB 13.1 12/19/2014 1416   HCT 43.1 12/09/2015 0456   HCT 39.0 12/19/2014 1416     Per Protocol for Patient with estCrcl< 30 ml/min and BMI < 40, will transition to Lovenox 30 mg Q24h.     Ulice Dash, PharmD Clinical Pharmacist

## 2015-12-10 ENCOUNTER — Inpatient Hospital Stay: Payer: Medicare Other

## 2015-12-10 ENCOUNTER — Ambulatory Visit: Payer: Medicare Other

## 2015-12-10 LAB — BASIC METABOLIC PANEL
ANION GAP: 11 (ref 5–15)
BUN: 58 mg/dL — ABNORMAL HIGH (ref 6–20)
CHLORIDE: 91 mmol/L — AB (ref 101–111)
CO2: 38 mmol/L — AB (ref 22–32)
Calcium: 9.2 mg/dL (ref 8.9–10.3)
Creatinine, Ser: 1.41 mg/dL — ABNORMAL HIGH (ref 0.44–1.00)
GFR calc non Af Amer: 34 mL/min — ABNORMAL LOW (ref >60)
GFR, EST AFRICAN AMERICAN: 40 mL/min — AB (ref >60)
Glucose, Bld: 171 mg/dL — ABNORMAL HIGH (ref 65–99)
POTASSIUM: 4.3 mmol/L (ref 3.5–5.1)
Sodium: 140 mmol/L (ref 135–145)

## 2015-12-10 MED ORDER — FUROSEMIDE 40 MG PO TABS: 40.0000 mg | ORAL_TABLET | Freq: Once | ORAL | Status: DC

## 2015-12-10 MED ORDER — TECHNETIUM TC 99M DIETHYLENETRIAME-PENTAACETIC ACID
31.4600 | Freq: Once | INTRAVENOUS | Status: AC | PRN
  Administered 2015-12-10: 15:00:00 31.46 via INTRAVENOUS

## 2015-12-10 MED ORDER — METOPROLOL TARTRATE 1 MG/ML IV SOLN: 5.0000 mg | INTRAVENOUS | Status: DC | PRN

## 2015-12-10 MED ORDER — LEVALBUTEROL HCL 1.25 MG/0.5ML IN NEBU
1.2500 mg | INHALATION_SOLUTION | Freq: Three times a day (TID) | RESPIRATORY_TRACT | Status: DC
Start: 2015-12-10 — End: 2015-12-11
  Administered 2015-12-10 – 2015-12-11 (×3): 1.25 mg via RESPIRATORY_TRACT
  Filled 2015-12-10 (×3): qty 0.5

## 2015-12-10 MED ORDER — METOPROLOL TARTRATE 25 MG PO TABS
25.0000 mg | ORAL_TABLET | Freq: Two times a day (BID) | ORAL | Status: DC
  Administered 2015-12-10 – 2015-12-11 (×2): 25 mg via ORAL
  Filled 2015-12-10 (×2): qty 1

## 2015-12-10 MED ORDER — LEVOFLOXACIN 750 MG PO TABS
750.0000 mg | ORAL_TABLET | ORAL | Status: DC
  Administered 2015-12-11: 750 mg via ORAL
  Filled 2015-12-10: qty 1

## 2015-12-10 MED ORDER — METHYLPREDNISOLONE SODIUM SUCC 40 MG IJ SOLR
40.0000 mg | Freq: Every day | INTRAMUSCULAR | Status: DC
  Administered 2015-12-11: 40 mg via INTRAVENOUS
  Filled 2015-12-10: qty 1

## 2015-12-10 MED ORDER — METOPROLOL TARTRATE 1 MG/ML IV SOLN
5.0000 mg | INTRAVENOUS | Status: DC | PRN
  Administered 2015-12-10: 11:00:00 5 mg via INTRAVENOUS
  Filled 2015-12-10: qty 5

## 2015-12-10 MED ORDER — IPRATROPIUM-ALBUTEROL 0.5-2.5 (3) MG/3ML IN SOLN: 3.0000 mL | Freq: Three times a day (TID) | RESPIRATORY_TRACT | Status: DC

## 2015-12-10 MED ORDER — TECHNETIUM TO 99M ALBUMIN AGGREGATED
3.8700 | Freq: Once | INTRAVENOUS | Status: AC | PRN
  Administered 2015-12-10: 3.87 via INTRAVENOUS

## 2015-12-10 MED ORDER — METOPROLOL SUCCINATE ER 25 MG PO TB24: 25.0000 mg | ORAL_TABLET | Freq: Every day | ORAL | Status: DC

## 2015-12-10 MED ORDER — LEVOFLOXACIN 750 MG PO TABS: 750.0000 mg | ORAL_TABLET | ORAL | Status: DC

## 2015-12-10 MED ORDER — METOPROLOL SUCCINATE ER 25 MG PO TB24: 12.5000 mg | ORAL_TABLET | Freq: Once | ORAL | Status: DC

## 2015-12-10 NOTE — Progress Notes (Signed)
Date: 12/10/2015,   MRN# [id number] Jacqueline Zamora 10-Aug-1933 Code Status:     Code Status Orders        Start     Ordered   12/05/15 0214  Full code   Continuous     12/05/15 0214    Code Status History    Date Active Date Inactive Code Status Order ID Comments User Context   This patient has a current code status but no historical code status.    Advance Directive Documentation        Most Recent Value   Type of Advance Directive  Healthcare Power of Attorney   Pre-existing out of facility DNR order (yellow form or pink MOST form)     "MOST" Form in Place?       HPI: still c/o dyspnea but comfortable in bed on 4 liters [place] 02 when I saw her. She had bouts of tachycardia.   PMHX:   Past Medical History  Diagnosis Date  . COPD (chronic obstructive pulmonary disease) ([place])   . Hypercalcemia   . Polycythemia   . Hyperlipidemia   . Vitamin D deficiency   . Vitamin B12 deficiency   . Pulmonary nodule   . Cancer ([place])     Small Cell Lung Cancer (RT)  . Pneumonia    Surgical Hx:  Past Surgical History  Procedure Laterality Date  . Tonsillectomy    . Colon surgery      Sigmoid Colectomy  . Cataract extraction    . Eye surgery      Cataract Extraction 2008  . Colonoscopy with propofol N/A 05/02/2015    Procedure: COLONOSCOPY WITH PROPOFOL;  Surgeon: Hulen Luster, MD;  [facility] ENDOSCOPY;  Service: Gastroenterology;  Laterality: N/A;  . Esophagogastroduodenoscopy (egd) with propofol N/A 05/02/2015    Procedure: ESOPHAGOGASTRODUODENOSCOPY (EGD) WITH PROPOFOL;  Surgeon: Hulen Luster, MD;  Location: [facility] ENDOSCOPY;  Service: Gastroenterology;  Laterality: N/A;   Family Hx:  Family History  Problem Relation Age of Onset  . Cancer Brother   . Breast cancer Daughter   . Thyroid cancer Daughter    Social Hx:   Social History  Substance Use Topics  . Smoking status: Current Every Day Smoker -- 1.00 packs/day for 62 years    Types: Cigarettes  . Smokeless tobacco: Never Used   . Alcohol Use: No   Medication:    Home Medication:  No current outpatient prescriptions on file.  Current Medication: '@CURMEDTAB'$ @   Allergies:  Iodine; Latex; Neomycin-bacitracin zn-polymyx; and Penicillins  Review of Systems: Gen:  Denies  fever, sweats, chills HEENT: Denies blurred vision, double vision, ear pain, eye pain, hearing loss, nose bleeds, sore throat Cvc:  No dizziness, chest pain or heaviness Resp: no real change   Gi: Denies swallowing difficulty, stomach pain, nausea or vomiting, diarrhea, constipation, bowel incontinence Gu:  Denies bladder incontinence, burning urine Ext:   No Joint pain, stiffness or swelling Skin: No skin rash, easy bruising or bleeding or hives Endoc:  No polyuria, polydipsia , polyphagia or weight change Psych: No depression, insomnia or hallucinations  Other:  All other systems negative  Physical Examination:   VS: BP 107/72 mmHg  Pulse 112  Temp(Src) 98.7 F (37.1 C) (Oral)  Resp 18  Ht '5\' 3"'$  (1.6 m)  Wt 127 lb (57.607 kg)  BMI 22.50 kg/m2  SpO2 95%  General Appearance: No distress, in bed, small frame  Neuro/psych without focal findings, mental status, speech normal, alert and oriented,  cranial nerves 2-12 intact, reflexes normal and symmetric, sensation grossly normal  HEENT: PERRLA, EOM intact, no ptosis, no other lesions noticed, Mallampati: Pulmonary:.wheezing, No rales    Cardiovascular:  Normal S1,S2.  No m/r/g.  Abdominal aorta pulsation normal.    Abdomen:Benign, Soft, non-tender, No masses, hepatosplenomegaly, No lymphadenopathy Endoc: No evident thyromegaly, no signs of acromegaly or Cushing features Skin:   warm, no rashes, no ecchymosis  Extremities: normal, no cyanosis, clubbing, no edema, warm with normal capillary refill. Other findings:   Labs results:   Recent Labs     12/09/15  0456  12/10/15  0448  HGB  14.4   --   HCT  43.1   --   MCV  92.5   --   WBC  5.1   --   BUN  46*  58*  CREATININE   1.32*  1.41*  GLUCOSE  188*  171*  CALCIUM  9.0  9.2  ,       Rad results:   Dg Chest 1 View  12/10/2015  CLINICAL DATA:  Pneumonia, sepsis, small cell lung malignancy, history of COPD. EXAM: CHEST 1 VIEW COMPARISON:  PA and lateral chest x-ray of December 08, 2015 FINDINGS: The lungs are hyperinflated. There is minimal blunting of the right lateral costophrenic angle which is stable. There is stable parenchymal density in the right infrahilar region. There is no pneumothorax. The heart is top-normal in size. The pulmonary vascularity is normal. The Port-A-Cath appliance tip projects over the distal third of the SVC. IMPRESSION: Slight interval improvement in the pulmonary interstitium since the previous study. Stable tiny right pleural effusion. Underlying COPD. Persistent prominence of the right infrahilar soft tissues consistent with middle lobe atelectasis, pneumonia, or mass. Electronically Signed   By: David  Martinique M.D.   On: 12/10/2015 07:18      Assessment and Plan: . Chronic airway obstruction, stage II copd, certainly part of the differential for her hypoxia, s/p xrt but chest not impressive for radiation pneumonitis. There is mild pulmonary edema, diuresis is in progress. improving  . Staph Aureus right side pneumonia, on levaquin   . Small cell lung cancer, right, hopefully in remission, following with oncology    Plan: -continue Dulera, duo neb, solumedrol  -cautious diuresis -incentive spirometry -wean o2 As tolerated -oncology follow up -stay off cigarettes -DVT prophylaxis -consider ct angio for pe (hypoxia, tachycardia)   I have personally obtained a history, examined the patient, evaluated laboratory and imaging results, formulated the assessment and plan and placed orders.  The Patient requires high complexity decision making for assessment and support, frequent evaluation and titration of therapies, application of advanced monitoring technologies and  extensive interpretation of multiple databases.   Cameryn Schum,M.D. Pulmonary & Critical care Medicine [facility]

## 2015-12-10 NOTE — Progress Notes (Signed)
Pharmacy Antibiotic Note  Jacqueline Zamora is a 82 y.o. female admitted on 12/04/2015 with sepsis/PNA/UTI.  Pharmacy has been consulted for Levaquin dosing.  Plan: Continue Levaquin 750 mg po q 48 hours. After discussion with Dr. Benjie Karvonen, will not place a stop date on Levaquin but will continue for now as patient's condition not improving significantly.   Height: '5\' 3"'$  (160 cm) Weight: 127 lb (57.607 kg) IBW/kg (Calculated) : 52.4  Temp (24hrs), Avg:97.9 F (36.6 C), Min:97.3 F (36.3 C), Max:98.7 F (37.1 C)   Recent Labs Lab 12/04/15 2155 12/05/15 0245 12/05/15 0810 12/07/15 0602 12/09/15 0456 12/10/15 0448  WBC 8.3 7.7 9.0  --  5.1  --   CREATININE 1.28* 1.23* 1.18* 1.23* 1.32* 1.41*  LATICACIDVEN 0.8  --   --   --   --   --     Estimated Creatinine Clearance: 26.3 mL/min (by C-G formula based on Cr of 1.41).    Allergies  Allergen Reactions  . Iodine Rash  . Latex Rash  . Neomycin-Bacitracin Zn-Polymyx Rash  . Penicillins Rash and Other (See Comments)    Family cannot answer follow-up questions    Antimicrobials this admission: Aztreonam 4/6 >> 4/6 vancomycin 4/6 >> 4/10 Levaquin 4/7 >>   Dose adjustments this admission:   Microbiology results: 4/6 BCx: NGTD x 2 4/6 [id number] pneumoniae- (Ampicillin, nitrofurantoin resistant )  4/7 sputum cx: MSSA   Thank you for allowing pharmacy to be a part of this patient's care.  Ulice Dash D, Pharm.D., BCPS Clinical Pharmacist 12/10/2015 2:24 PM

## 2015-12-10 NOTE — Care Management Important Message (Signed)
Important Message  Patient Details  Name: Jacqueline Zamora MRN: [id number] Date of Birth: [birth year]   Medicare Important Message Given:  Yes    Shelbie Ammons, RN 12/10/2015, 8:15 AM

## 2015-12-10 NOTE — Progress Notes (Addendum)
[facility] [place] NAME: Jacqueline Zamora    MR#:  [id number]  DATE OF BIRTH:  [birth year]  SUBJECTIVE:    Patient feels less SOB this am  weak  REVIEW OF SYSTEMS:    Review of Systems  Constitutional: Positive for malaise/fatigue. Negative for fever and chills.  HENT: Negative for hearing loss.   Eyes: Negative for blurred vision, double vision, photophobia and discharge.  Respiratory: Positive for cough and shortness of breath (better). Negative for hemoptysis and sputum production.   Cardiovascular: Negative for palpitations, orthopnea and leg swelling.  Gastrointestinal: Negative for vomiting, abdominal pain and diarrhea.  Genitourinary: Negative for dysuria and urgency.  Musculoskeletal: Negative for myalgias and neck pain.  Skin: Negative for rash.  Neurological: Positive for weakness. Negative for dizziness, focal weakness, seizures and headaches.  Psychiatric/Behavioral: Negative for memory loss. The patient does not have insomnia.     DRUG ALLERGIES:   Allergies  Allergen Reactions  . Iodine Rash  . Latex Rash  . Neomycin-Bacitracin Zn-Polymyx Rash  . Penicillins Rash and Other (See Comments)    Family cannot answer follow-up questions    VITALS:  Blood pressure 132/85, pulse 128, temperature 97.5 F (36.4 C), temperature source Oral, resp. rate 18, height '5\' 3"'$  (1.6 m), weight 57.607 kg (127 lb), SpO2 93 %.  PHYSICAL EXAMINATION:   Physical Exam  Constitutional: She is oriented to person, place, and time and well-developed, well-nourished, and in no distress. No distress.  HENT:  Head: Normocephalic.  Eyes: No scleral icterus.  Neck: Normal range of motion. Neck supple. No JVD present. No tracheal deviation present.  Cardiovascular: Normal rate, regular rhythm and normal heart sounds.  Exam reveals no gallop and no friction rub.   No murmur heard. Pulmonary/Chest: Effort normal. No respiratory distress. She  has no wheezes. She has no rales. She exhibits no tenderness.  Decreased throughout   Abdominal: Soft. Bowel sounds are normal. She exhibits no distension and no mass. There is no tenderness. There is no rebound and no guarding.  Musculoskeletal: Normal range of motion. She exhibits no edema.  Neurological: She is alert and oriented to person, place, and time.  Skin: Skin is warm. No rash noted. No erythema.  Psychiatric: Affect and judgment normal.      LABORATORY PANEL:   CBC  Recent Labs Lab 12/09/15 0456  WBC 5.1  HGB 14.4  HCT 43.1  PLT 147*   ------------------------------------------------------------------------------------------------------------------  Chemistries   Recent Labs Lab 12/04/15 2155  12/05/15 2026  12/10/15 0448  NA 134*  < >  --   < > 140  K 4.4  < >  --   < > 4.3  CL 101  < >  --   < > 91*  CO2 31  < >  --   < > 38*  GLUCOSE 134*  < >  --   < > 171*  BUN 34*  < >  --   < > 58*  CREATININE 1.28*  < >  --   < > 1.41*  CALCIUM 8.1*  < >  --   < > 9.2  MG  --   --  1.8  --   --   AST 24  --   --   --   --   ALT 14  --   --   --   --   ALKPHOS 63  --   --   --   --  BILITOT 0.5  --   --   --   --   < > = values in this interval not displayed. ------------------------------------------------------------------------------------------------------------------  Cardiac Enzymes  Recent Labs Lab 12/05/15 2026  TROPONINI 0.04*   ------------------------------------------------------------------------------------------------------------------  RADIOLOGY:  Dg Chest 1 View  12/10/2015  CLINICAL DATA:  Pneumonia, sepsis, small cell lung malignancy, history of COPD. EXAM: CHEST 1 VIEW COMPARISON:  PA and lateral chest x-ray of December 08, 2015 FINDINGS: The lungs are hyperinflated. There is minimal blunting of the right lateral costophrenic angle which is stable. There is stable parenchymal density in the right infrahilar region. There is no  pneumothorax. The heart is top-normal in size. The pulmonary vascularity is normal. The Port-A-Cath appliance tip projects over the distal third of the SVC. IMPRESSION: Slight interval improvement in the pulmonary interstitium since the previous study. Stable tiny right pleural effusion. Underlying COPD. Persistent prominence of the right infrahilar soft tissues consistent with middle lobe atelectasis, pneumonia, or mass. Electronically Signed   By: David  Martinique M.D.   On: 12/10/2015 07:18   Dg Chest 2 View  12/08/2015  CLINICAL DATA:  Progressive weakness.  Follow-up. EXAM: CHEST  2 VIEW COMPARISON:  12/04/2015 . FINDINGS: Port-A-Cath noted with tip projected over the superior vena cava in stable position. Mediastinum and hilar structures are stable. Cardiomegaly with with mild pulmonary vascular prominence and basilar interstitial prominence with small pleural effusions suggesting a mild component congestive heart failure. Persistent right middle lobe atelectasis and/or mass lesion. No pneumothorax. No acute bony abnormality. IMPRESSION: 1.  Port-A-Cath in stable position. 2.  Persistent right middle lobe atelectasis and/or mass lesion. 3. Cardiomegaly with pulmonary venous congestion, basilar interstitial prominence, small pleural effusions suggesting mild congestive heart failure. Electronically Signed   By: Marcello Moores  Register   On: 12/08/2015 11:19     ASSESSMENT AND PLAN:  82 year old female with a history of COPD  and small cell lung cancer who presents with generalized weakness and found to have pneumonia.  1.Staph aureus Right sided community acquired pneumonia with acute hypoxic respiratory failure and COPD exacerbation with sepsis and acute diastolic congestive heart failure: Continue Levaquin for pneumonia and Klebsiella UTI.  Dr Vella Kohler saw patient and recommended continued treatment. He did not feel that there is a significant component of XRT pneumonitis  CXR this am with improved  pulmonary edema Change to PO Lasix  Wean IV steroids.  Continue inhalers and DuoNeb's.  Continus ISS.   2. Klebsiella UTI   Continue with Levaquin.   3. history of small cell lung cancer Follows up at [facility]  4 chronic hypomagnesemia She periodically gets IV magnesium at the cancer center.  5. Mild cognitive impairment Outpatient follow-up  6. Tobacco use  continue nicotine patch  Discussed with patient   7. Acute diastolic heart failure: Echo shows normal ejection fraction with mild concentric LVH Continue  Lasix and monitoring of creatinine.  8 Sinus tachycardia: Change to [facility]  Check CT/vq for PE   management plans discussed with patient.  I left message for daughter  Patient will need skilled nursing facility at discharge.  CODE STATUS: FULL CODE  TOTAL TIME TAKING CARE OF THIS PATIENT: 25 minutes.   POSSIBLE D/C IN 2-3 DAYS, DEPENDING ON CLINICAL CONDITION.  Anaiz Qazi M.D on 12/10/2015 at 10:36 AM  Between 7am to 6pm - Pager - [phone]  After 6pm go to [email or web address] - password [email or web address] [facility]  [facility]  Office  [phone]  CC: Primary care physician; BERT  Odetta Pink, MD

## 2015-12-10 NOTE — Care Management Important Message (Signed)
Important Message  Patient Details  Name: EASTYN SKALLA MRN: [id number] Date of Birth: [birth year]   Medicare Important Message Given:  Yes    Shelbie Ammons, RN 12/10/2015, 8:16 AM

## 2015-12-10 NOTE — Progress Notes (Signed)
Physical Therapy Treatment Patient Details Name: Jacqueline Zamora MRN: [id number] DOB: 03/25/1933 Today's Date: 12/10/2015    History of Present Illness Pt has been weak and having a cough over the last few weaks and has had many falls in the last few months.  She has a history of lung cancer and was apparently supposed to have O2, though she has never had it at home.      PT Comments    Pt agreeable to PT. Reports feeling better; however, continues to feel quite weak. O2 saturation on 5 liters 93% throughout session. Heart rate elevated to 129 at rest to 133 beats per minute with light activity. Pt able to perform sit to stand with Min A; however, poor endurance/tolerance. After several stands pt does tolerate several steps bed to chair. Pt participates in exercises both edge of bed and in long sit. Pt encouraged to perform exercises throughout the day. Pt received up in chair comfortably. Continue PT to progress strength, endurance to improve all functional mobility.   Follow Up Recommendations  SNF     Equipment Recommendations  3in1 (PT)    Recommendations for Other Services       Precautions / Restrictions Precautions Precautions: Fall Restrictions Weight Bearing Restrictions: No    Mobility  Bed Mobility Overal bed mobility: Modified Independent Bed Mobility: Supine to Sit     Supine to sit: Modified independent (Device/Increase time)     General bed mobility comments: Increased time and use of rails. Effortful. Leans L, but corrects independently with cues  Transfers Overall transfer level: Needs assistance Equipment used: Rolling walker (2 wheeled) Transfers: Sit to/from Stand Sit to Stand: Min assist (Performed several times before able to ambulate to chair)         General transfer comment: Feels very weak upon stand; only tolerates several seconds with need to sit before tolerating transfer to chair  Ambulation/Gait Ambulation/Gait assistance: Min guard;Min  assist Ambulation Distance (Feet): 3 Feet Assistive device: Rolling walker (2 wheeled) Gait Pattern/deviations: Step-to pattern (bed to chair) Gait velocity: slow Gait velocity interpretation: Below normal speed for age/gender General Gait Details: short unsteady steps bed to chair   Stairs            Wheelchair Mobility    Modified Rankin (Stroke Patients Only)       Balance Overall balance assessment: Needs assistance Sitting-balance support: Bilateral upper extremity supported;Feet supported Sitting balance-Leahy Scale: Fair Sitting balance - Comments: Occasional lean to left, but able to self correct with cues Postural control: Left lateral lean Standing balance support: Bilateral upper extremity supported Standing balance-Leahy Scale: Fair Standing balance comment: Poor endurance                    Cognition Arousal/Alertness: Awake/alert Behavior During Therapy: WFL for tasks assessed/performed Overall Cognitive Status: Within Functional Limits for tasks assessed                      Exercises General Exercises - Lower Extremity Ankle Circles/Pumps: AROM;Both;20 reps (long sit) Quad Sets: Strengthening;Both;20 reps (long sit) Gluteal Sets: Strengthening;Both;20 reps (long sit) Long Arc Quad: AROM;Both;20 reps;Seated Heel Slides: AROM;Both;20 reps (long sit) Hip ABduction/ADduction: AROM;Both;20 reps;Seated (90/90 position) Hip Flexion/Marching: AROM;Both;20 reps;Seated Toe Raises: AROM;Both;20 reps;Seated Heel Raises: AROM;Both;20 reps;Seated    General Comments        Pertinent Vitals/Pain Pain Assessment: No/denies pain    Home Living  Prior Function            PT Goals (current goals can now be found in the care plan section) Progress towards PT goals: Progressing toward goals (slowly)    Frequency  Min 2X/week    PT Plan Current plan remains appropriate    Co-evaluation              End of Session Equipment Utilized During Treatment: Oxygen Activity Tolerance: Patient limited by fatigue (elevated heart rate 129-133 bpm) Patient left: in chair;with call bell/phone within reach;with chair alarm set     Time: 1027-1106 PT Time Calculation (min) (ACUTE ONLY): 39 min  Charges:  $Gait Training: 8-22 mins $Therapeutic Exercise: 8-22 mins $Therapeutic Activity: 8-22 mins                    G Codes:      Charlaine Dalton, PTA 12/10/2015, 11:26 AM

## 2015-12-10 NOTE — Progress Notes (Signed)
PHARMACIST - PHYSICIAN COMMUNICATION DR:   Benjie Karvonen CONCERNING: Antibiotic IV to Oral Route Change Policy  RECOMMENDATION: This patient is receiving Levaquin by the intravenous route.  Based on criteria approved by the Pharmacy and Therapeutics Committee, the antibiotic(s) is/are being converted to the equivalent oral dose form(s).   DESCRIPTION: These criteria include:  Patient being treated for a respiratory tract infection, urinary tract infection, cellulitis or clostridium difficile associated diarrhea if on metronidazole  The patient is not neutropenic and does not exhibit a GI malabsorption state  The patient is eating (either orally or via tube) and/or has been taking other orally administered medications for a least 24 hours  The patient is improving clinically and has a Tmax < 100.5  If you have questions about this conversion, please contact the Pharmacy Department  '[]'$   [phone] )  [facility] '[x]'$   [phone] )  [facility] '[]'$   [phone] )  [facility] '[]'$   [phone] )  [facility] '[]'$   [phone] )  [place], PharmD Clinical Pharmacist

## 2015-12-11 LAB — CBC
HEMATOCRIT: 46.3 % (ref 35.0–47.0)
Hemoglobin: 15.5 g/dL (ref 12.0–16.0)
MCH: 30.1 pg (ref 26.0–34.0)
MCHC: 33.4 g/dL (ref 32.0–36.0)
MCV: 89.9 fL (ref 80.0–100.0)
Platelets: 188 10*3/uL (ref 150–440)
RBC: 5.15 MIL/uL (ref 3.80–5.20)
RDW: 14.3 % (ref 11.5–14.5)
WBC: 6 10*3/uL (ref 3.6–11.0)

## 2015-12-11 LAB — BASIC METABOLIC PANEL
Anion gap: 8 (ref 5–15)
BUN: 76 mg/dL — AB (ref 6–20)
CHLORIDE: 91 mmol/L — AB (ref 101–111)
CO2: 41 mmol/L — AB (ref 22–32)
Calcium: 9.2 mg/dL (ref 8.9–10.3)
Creatinine, Ser: 1.4 mg/dL — ABNORMAL HIGH (ref 0.44–1.00)
GFR calc Af Amer: 40 mL/min — ABNORMAL LOW (ref >60)
GFR calc non Af Amer: 34 mL/min — ABNORMAL LOW (ref >60)
GLUCOSE: 108 mg/dL — AB (ref 65–99)
POTASSIUM: 4.2 mmol/L (ref 3.5–5.1)
Sodium: 140 mmol/L (ref 135–145)

## 2015-12-11 LAB — CULTURE, BLOOD (ROUTINE X 2)
CULTURE: NO GROWTH
Culture: NO GROWTH

## 2015-12-11 MED ORDER — PREDNISONE 5 MG PO TABS: 10.0000 mg | ORAL_TABLET | Freq: Every day | ORAL | Status: DC

## 2015-12-11 MED ORDER — METOPROLOL TARTRATE 25 MG PO TABS: 25.0000 mg | ORAL_TABLET | Freq: Two times a day (BID) | ORAL | Status: AC

## 2015-12-11 MED ORDER — PREDNISONE 10 MG PO TABS: 10.0000 mg | ORAL_TABLET | Freq: Every day | ORAL | Status: AC

## 2015-12-11 MED ORDER — LEVOFLOXACIN 750 MG PO TABS: 750.0000 mg | ORAL_TABLET | ORAL | Status: AC

## 2015-12-11 MED ORDER — HEPARIN SOD (PORK) LOCK FLUSH 100 UNIT/ML IV SOLN
500.0000 [IU] | INTRAVENOUS | Status: AC | PRN
  Administered 2015-12-11: 13:00:00 500 [IU]
  Filled 2015-12-11: qty 5

## 2015-12-11 MED ORDER — SODIUM CHLORIDE 0.9% FLUSH
10.0000 mL | INTRAVENOUS | Status: AC | PRN
  Administered 2015-12-11: 10 mL

## 2015-12-11 MED ORDER — ENSURE ENLIVE PO LIQD: 237.0000 mL | Freq: Two times a day (BID) | ORAL | Status: AC

## 2015-12-11 MED ORDER — NICOTINE 14 MG/24HR TD PT24: 14.0000 mg | MEDICATED_PATCH | Freq: Every day | TRANSDERMAL | Status: AC

## 2015-12-11 NOTE — Progress Notes (Signed)
Pt transferred to [facility] via EMS per orders, daughter notified per request

## 2015-12-11 NOTE — Discharge Summary (Signed)
[facility] [place] NAME: Jacqueline Zamora    MR#:  [id number]  DATE OF BIRTH:  10-31-1932  DATE OF ADMISSION:  12/04/2015 ADMITTING PHYSICIAN: Lance Coon, MD  DATE OF DISCHARGE: 12/11/2015  PRIMARY CARE PHYSICIAN: Tama High III, MD    ADMISSION DIAGNOSIS:  Community acquired pneumonia [J18.9] Hypoxia [R09.02] Sepsis, due to unspecified organism ([place]) [A41.9]  DISCHARGE DIAGNOSIS:  Principal Problem:   Sepsis ([place]) Active Problems:   HLD (hyperlipidemia)   SCC of lung (small cell carcinoma) ([place])   CAP (community acquired pneumonia)   Anxiety   COPD (chronic obstructive pulmonary disease) (HCC)   UTI (lower urinary tract infection)   SECONDARY DIAGNOSIS:   Past Medical History  Diagnosis Date  . COPD (chronic obstructive pulmonary disease) ([place])   . Hypercalcemia   . Polycythemia   . Hyperlipidemia   . Vitamin D deficiency   . Vitamin B12 deficiency   . Pulmonary nodule   . Cancer ([place])     Small Cell Lung Cancer ([facility])  . Pneumonia     HOSPITAL COURSE:    82 year old female with a history of COPD and small cell lung cancer who presents with generalized weakness and found to have pneumonia.  1.Staph aureus Right sided community acquired pneumonia with acute hypoxic respiratory failure and COPD exacerbation with sepsis and acute diastolic congestive heart failure: She will Continue Levaquin for pneumonia and Klebsiella UTI.  Dr Vella Kohler saw patient and recommended continued treatment. He did not feel that there is a significant component of XRT pneumonitis. V.q scan did not show pulmonary emboli but was not a good study due to history of radiation. Her O2 requirement did decrease from 5-6 L to 3 L. I suspect that as an outpatient she required oxygen but was never evaluated. Acute diastolic heart failure was treated with IV Lasix. She has diuresed and does not need Lasix at this time. She will continue with  steroid taper. She is on 10 mg of prednisone daily as an outpatient.  2. Klebsiella UTI : She has been treated with Levaquin. 3. history of small cell lung cancer Follows up at [facility] She is a follow-up appointment April 18. 4 chronic hypomagnesemia She periodically gets IV magnesium at the cancer center.  5. Mild cognitive impairment Outpatient follow-up  6. Tobacco use: She was counseled on admission.   7. Acute diastolic heart failure: Echo shows normal ejection fraction with mild concentric LVH She was diuresed with IV Lasix. Her creatinine remains stable at 1.4.  At this point I do not feel she needs oral Lasix.   8 Sinus tachycardia:This has improved with increasing metoprolol. V/q scan was ordered which was not conclusive due to history of radiation. Her O2 requirement has improved.  DISCHARGE CONDITIONS AND DIET:   Stable for discharge to skilled nursing facility on a regular diet  CONSULTS OBTAINED:  Treatment Team:  Erby Pian, MD  DRUG ALLERGIES:   Allergies  Allergen Reactions  . Iodine Rash  . Latex Rash  . Neomycin-Bacitracin Zn-Polymyx Rash  . Penicillins Rash and Other (See Comments)    Family cannot answer follow-up questions    DISCHARGE MEDICATIONS:   Current Discharge Medication List    START taking these medications   Details  feeding supplement, ENSURE ENLIVE, (ENSURE ENLIVE) LIQD Take 237 mLs by mouth 2 (two) times daily between meals. Qty: 237 mL, Refills: 12    levofloxacin (LEVAQUIN) 750 MG tablet Take 1  tablet (750 mg total) by mouth every other day. Qty: 2 tablet, Refills: 0    metoprolol tartrate (LOPRESSOR) 25 MG tablet Take 1 tablet (25 mg total) by mouth 2 (two) times daily. Qty: 60 tablet, Refills: 0    nicotine (NICODERM CQ - DOSED IN MG/24 HOURS) 14 mg/24hr patch Place 1 patch (14 mg total) onto the skin daily. Qty: 28 patch, Refills: 0      CONTINUE these medications which have CHANGED   Details  !!  predniSONE (DELTASONE) 10 MG tablet Take 1 tablet (10 mg total) by mouth daily with breakfast. Start at 50 mg daily for 2 days taper by 10 mg every 2 days when at 10 mg daily take this indefinetley as you are at home. Qty: 20 tablet, Refills: 0    !! predniSONE (DELTASONE) 5 MG tablet Take 2 tablets (10 mg total) by mouth daily with breakfast. Patient will take prednisone taper then resume daily 10 mg dose Qty: 30 tablet, Refills: 0     !! - Potential duplicate medications found. Please discuss with provider.    CONTINUE these medications which have NOT CHANGED   Details  albuterol (PROVENTIL HFA;VENTOLIN HFA) 108 (90 Base) MCG/ACT inhaler Inhale 2 puffs into the lungs every 6 (six) hours as needed for wheezing or shortness of breath.    ALPRAZolam (XANAX) 0.25 MG tablet Take 1 tablet (0.25 mg total) by mouth 2 (two) times daily as needed for anxiety. Qty: 60 tablet, Refills: 1   Associated Diagnoses: Malignant neoplasm of lung, unspecified laterality, unspecified part of lung (HCC)    azelastine (ASTELIN) 0.1 % nasal spray Place 1 spray into both nostrils 2 (two) times daily as needed for rhinitis or allergies. Use in each nostril as directed    cholecalciferol (VITAMIN D) 1000 units tablet Take 1,000 Units by mouth daily.    cyanocobalamin 500 MCG tablet Take 500 mcg by mouth daily.    fluticasone (FLONASE) 50 MCG/ACT nasal spray Place 2 sprays into the nose daily as needed for allergies or rhinitis.    Associated Diagnoses: Malignant neoplasm of lung, unspecified laterality, unspecified part of lung (HCC)    Fluticasone-Salmeterol (ADVAIR DISKUS) 250-50 MCG/DOSE AEPB Inhale 1 puff into the lungs 2 (two) times daily.    HYDROcodone-acetaminophen (NORCO/VICODIN) 5-325 MG tablet Take 0.5-1 tablets by mouth every 6 (six) hours as needed for moderate pain.    omeprazole (PRILOSEC) 20 MG capsule Take 1 capsule (20 mg total) by mouth 2 (two) times daily before a meal. Qty: 60 capsule,  Refills: 3   Associated Diagnoses: SCC of lung (small cell carcinoma), unspecified laterality ([place])              Today   CHIEF COMPLAINT:  Patient is doing well this point. Patient denies shortness breath or cough. Patient denies chest pain.  VITAL SIGNS:  Blood pressure 120/87, pulse 114, temperature 97.8 F (36.6 C), temperature source Oral, resp. rate 18, height '5\' 3"'$  (1.6 m), weight 57.607 kg (127 lb), SpO2 96 %.   REVIEW OF SYSTEMS:  Review of Systems  Constitutional: Negative for fever, chills and malaise/fatigue.  HENT: Negative for ear discharge, ear pain, hearing loss, nosebleeds and sore throat.   Eyes: Negative for blurred vision and pain.  Respiratory: Negative for cough, hemoptysis, shortness of breath and wheezing.   Cardiovascular: Negative for chest pain, palpitations and leg swelling.  Gastrointestinal: Negative for nausea, vomiting, abdominal pain, diarrhea and blood in stool.  Genitourinary: Negative for dysuria.  Musculoskeletal:  Negative for back pain.  Neurological: Negative for dizziness, tremors, speech change, focal weakness, seizures and headaches.  Endo/Heme/Allergies: Does not bruise/bleed easily.  Psychiatric/Behavioral: Negative for depression, suicidal ideas and hallucinations.     PHYSICAL EXAMINATION:  GENERAL:  82 y.o.-year-old patient lying in the bed with no acute distress.  NECK:  Supple, no jugular venous distention. No thyroid enlargement, no tenderness.  LUNGS: Normal breath sounds bilaterally, no wheezing, rales,rhonchi  No use of accessory muscles of respiration.  CARDIOVASCULAR: S1, S2 normal. No murmurs, rubs, or gallops.  ABDOMEN: Soft, non-tender, non-distended. Bowel sounds present. No organomegaly or mass.  EXTREMITIES: No pedal edema, cyanosis, or clubbing.  PSYCHIATRIC: The patient is alert and oriented x 3.  SKIN: No obvious rash, lesion, or ulcer.   DATA REVIEW:   CBC  Recent Labs Lab 12/11/15 0503  WBC 6.0   HGB 15.5  HCT 46.3  PLT 188    Chemistries   Recent Labs Lab 12/04/15 2155  12/05/15 2026  12/11/15 0503  NA 134*  < >  --   < > 140  K 4.4  < >  --   < > 4.2  CL 101  < >  --   < > 91*  CO2 31  < >  --   < > 41*  GLUCOSE 134*  < >  --   < > 108*  BUN 34*  < >  --   < > 76*  CREATININE 1.28*  < >  --   < > 1.40*  CALCIUM 8.1*  < >  --   < > 9.2  MG  --   --  1.8  --   --   AST 24  --   --   --   --   ALT 14  --   --   --   --   ALKPHOS 63  --   --   --   --   BILITOT 0.5  --   --   --   --   < > = values in this interval not displayed.  Cardiac Enzymes  Recent Labs Lab 12/05/15 0245 12/05/15 0810 12/05/15 2026  TROPONINI 0.07* 0.06* 0.04*    Microbiology Results  '@MICRORSLT48'$ @  RADIOLOGY:  Dg Chest 1 View  12/10/2015  CLINICAL DATA:  Pneumonia, sepsis, small cell lung malignancy, history of COPD. EXAM: CHEST 1 VIEW COMPARISON:  PA and lateral chest x-ray of December 08, 2015 FINDINGS: The lungs are hyperinflated. There is minimal blunting of the right lateral costophrenic angle which is stable. There is stable parenchymal density in the right infrahilar region. There is no pneumothorax. The heart is top-normal in size. The pulmonary vascularity is normal. The Port-A-Cath appliance tip projects over the distal third of the SVC. IMPRESSION: Slight interval improvement in the pulmonary interstitium since the previous study. Stable tiny right pleural effusion. Underlying COPD. Persistent prominence of the right infrahilar soft tissues consistent with middle lobe atelectasis, pneumonia, or mass. Electronically Signed   By: David  Martinique M.D.   On: 12/10/2015 07:18   Nm Pulmonary Perf And Vent  12/10/2015  CLINICAL DATA:  History of lung cancer, lung surgery and radiation therapy. Smoker. Hypoxia. EXAM: NUCLEAR MEDICINE VENTILATION - PERFUSION LUNG SCAN TECHNIQUE: Ventilation images were obtained in multiple projections using inhaled aerosol Tc-37mDTPA. Perfusion images were  obtained in multiple projections after intravenous injection of Tc-936mAA. RADIOPHARMACEUTICALS:  31.46 mCi Technetium-9958mPA aerosol inhalation and 3.87 mCi Technetium-4m1m  IV COMPARISON:  Chest radiograph from today FINDINGS: Chest x-ray: There are advanced changes of emphysema identified in both lungs. Postop changes within the right lower lobe are identified. The lungs appear hyperinflated. Ventilation: There is heterogeneous radiotracer activity identified throughout both lungs. Multiple areas of radiotracer clumping within the large airways is identified compatible with changes of COPD. Bandlike areas of decreased ventilation across both lower lobes are identified and are favored to represent sequelae from external beam radiation. No on MAC segmental perfusion defects identified. Perfusion: There is heterogeneous perfusion to both lungs. Bandlike areas of decreased perfusion within both lower lobes are identified and correlate with the ventilation abnormalities. IMPRESSION: 1. There are postsurgical scratch set post therapeutic changes are identified within both lower lung zones. This diminishes sensitivity for the assessment of perfusion defects within these affected areas. No on max segmental perfusion defects identified specific for pulmonary embolus. However if there is a high clinical suspicion for pulmonary embolus CTA of the chest would be advised as well as consideration for lower extremity Doppler. 2. Advanced obstructive pulmonary disease. Electronically Signed   By: Kerby Moors M.D.   On: 12/10/2015 15:14      Management plans discussed with the patient and she is in agreement. Stable for discharge SNF  Patient should follow up with PCP in 1 week  CODE STATUS:     Code Status Orders        Start     Ordered   12/05/15 0214  Full code   Continuous     12/05/15 0214    Code Status History    Date Active Date Inactive Code Status Order ID Comments User Context   This  patient has a current code status but no historical code status.    Advance Directive Documentation        Most Recent Value   Type of Advance Directive  Healthcare Power of Attorney   Pre-existing out of facility DNR order (yellow form or pink MOST form)     "MOST" Form in Place?        TOTAL TIME TAKING CARE OF THIS PATIENT: 35 minutes.    Note: This dictation was prepared with Dragon dictation along with smaller phrase technology. Any transcriptional errors that result from this process are unintentional.  Fallon Haecker M.D on 12/11/2015 at 10:15 AM  Between 7am to 6pm - Pager - [phone] After 6pm go to [email or web address] - password [email or web address] [facility]  [facility]  Office  [phone]  CC: Primary care physician; Adin Hector, MD

## 2015-12-11 NOTE — Progress Notes (Signed)
Pharmacy Antibiotic Note- Levofloxacin Day 7  Jacqueline Zamora is a 82 y.o. female admitted on 12/04/2015 with sepsis/PNA/UTI.  Pharmacy has been consulted for Levaquin dosing.  Plan: Continue Levaquin 750 mg po q 48 hours.   Height: '5\' 3"'$  (160 cm) Weight: 127 lb (57.607 kg) IBW/kg (Calculated) : 52.4  Temp (24hrs), Avg:98.2 F (36.8 C), Min:97.8 F (36.6 C), Max:98.7 F (37.1 C)   Recent Labs Lab 12/04/15 2155 12/05/15 0245 12/05/15 0810 12/07/15 0602 12/09/15 0456 12/10/15 0448 12/11/15 0503  WBC 8.3 7.7 9.0  --  5.1  --  6.0  CREATININE 1.28* 1.23* 1.18* 1.23* 1.32* 1.41* 1.40*  LATICACIDVEN 0.8  --   --   --   --   --   --     Estimated Creatinine Clearance: 26.5 mL/min (by C-G formula based on Cr of 1.4).    Allergies  Allergen Reactions  . Iodine Rash  . Latex Rash  . Neomycin-Bacitracin Zn-Polymyx Rash  . Penicillins Rash and Other (See Comments)    Family cannot answer follow-up questions    Antimicrobials this admission: Aztreonam 4/6 >> 4/6 vancomycin 4/6 >> 4/10 Levaquin 4/7 >>   Dose adjustments this admission:   Microbiology results: 4/6 BCx: NGTD x 2 4/6 IFX:[id number] pneumoniae- (Ampicillin, nitrofurantoin resistant )  4/7 Sputum cx: MSSA   Pharmacy will continue to monitor and adjust per consult.   Simpson,Michael L 12/11/2015 9:34 AM

## 2015-12-11 NOTE — Clinical Social Work Placement (Signed)
   CLINICAL SOCIAL WORK PLACEMENT  NOTE  Date:  12/11/2015  Patient Details  Name: Jacqueline Zamora MRN: [id number] Date of Birth: [birth year]  Clinical Social Work is seeking post-discharge placement for this patient at the [place] level of care (*CSW will initial, date and re-position this form in  chart as items are completed):  Yes   Patient/family provided with [facility] Department's list of facilities offering this level of care within the geographic area requested by the patient (or if unable, by the patient's family).  Yes   Patient/family informed of their freedom to choose among providers that offer the needed level of care, that participate in Medicare, Medicaid or managed care program needed by the patient, have an available bed and are willing to accept the patient.      Patient/family informed of [facility]'s ownership interest in [facility] and [facility], as well as of the fact that they are under no obligation to receive care at these facilities.  PASRR submitted to EDS on 12/05/15     PASRR number received on 12/05/15     Existing PASRR number confirmed on       FL2 transmitted to all facilities in geographic area requested by pt/family on 12/05/15     FL2 transmitted to all facilities within larger geographic area on       Patient informed that his/her managed care company has contracts with or will negotiate with certain facilities, including the following:        Yes   Patient/family informed of bed offers received.  Patient [facility] bed at [facility]     Physician recommends and patient chooses bed at  [facility])    Patient to be transferred to [facility] on 12/11/15.  Patient to be transferred to facility by [facility]     Patient family notified on 12/11/15 of transfer.  Name of family member notified:  Butch Penny, daughter     PHYSICIAN       Additional Comment:     _______________________________________________ Darden Dates, LCSW 12/11/2015, 12:10 PM

## 2015-12-11 NOTE — Clinical Social Work Note (Signed)
Pt is ready for discharge today to [facility]. Pt and family are aware and agreeable to discharge plan. CSW updated facility and they are able to accept pt as they received discharge information. RN to call report and EMS will provide transportation to facility. CSW is signing off as no further needs identified.   Darden Dates, MSW, LCSW  Clinical Social Worker  [phone]

## 2015-12-11 NOTE — Progress Notes (Signed)
Spoke with Jacqueline Zamora, [facility] rep at [phone], to notify of non-emergent EMS transport.  Auth notification reference given as [id number].   Service date range good from 12/11/15 - 03/10/16.   Gap exception requested to determine if services can be considered at an in-network level.

## 2015-12-11 NOTE — Progress Notes (Signed)
Report called to angela at [facility] per orders for pt transport. EMS notified of transport

## 2015-12-16 ENCOUNTER — Encounter: Payer: Self-pay | Admitting: Oncology

## 2015-12-16 ENCOUNTER — Inpatient Hospital Stay (HOSPITAL_BASED_OUTPATIENT_CLINIC_OR_DEPARTMENT_OTHER): Payer: Medicare Other | Admitting: Oncology

## 2015-12-16 ENCOUNTER — Other Ambulatory Visit: Payer: Self-pay | Admitting: *Deleted

## 2015-12-16 ENCOUNTER — Inpatient Hospital Stay: Payer: Medicare Other

## 2015-12-16 VITALS — BP 99/70 | HR 115 | Temp 97.6°F | Resp 18 | Wt 113.0 lb

## 2015-12-16 DIAGNOSIS — Z7952 Long term (current) use of systemic steroids: Secondary | ICD-10-CM | POA: Diagnosis not present

## 2015-12-16 DIAGNOSIS — Z923 Personal history of irradiation: Secondary | ICD-10-CM | POA: Diagnosis not present

## 2015-12-16 DIAGNOSIS — J449 Chronic obstructive pulmonary disease, unspecified: Secondary | ICD-10-CM | POA: Insufficient documentation

## 2015-12-16 DIAGNOSIS — Z780 Asymptomatic menopausal state: Secondary | ICD-10-CM | POA: Insufficient documentation

## 2015-12-16 DIAGNOSIS — Z79899 Other long term (current) drug therapy: Secondary | ICD-10-CM | POA: Insufficient documentation

## 2015-12-16 DIAGNOSIS — C3490 Malignant neoplasm of unspecified part of unspecified bronchus or lung: Secondary | ICD-10-CM

## 2015-12-16 DIAGNOSIS — R0602 Shortness of breath: Secondary | ICD-10-CM

## 2015-12-16 DIAGNOSIS — C3491 Malignant neoplasm of unspecified part of right bronchus or lung: Secondary | ICD-10-CM

## 2015-12-16 DIAGNOSIS — R0902 Hypoxemia: Secondary | ICD-10-CM | POA: Insufficient documentation

## 2015-12-16 DIAGNOSIS — Z85048 Personal history of other malignant neoplasm of rectum, rectosigmoid junction, and anus: Secondary | ICD-10-CM | POA: Diagnosis not present

## 2015-12-16 DIAGNOSIS — R05 Cough: Secondary | ICD-10-CM

## 2015-12-16 DIAGNOSIS — E785 Hyperlipidemia, unspecified: Secondary | ICD-10-CM | POA: Diagnosis not present

## 2015-12-16 DIAGNOSIS — J45909 Unspecified asthma, uncomplicated: Secondary | ICD-10-CM | POA: Insufficient documentation

## 2015-12-16 DIAGNOSIS — D751 Secondary polycythemia: Secondary | ICD-10-CM

## 2015-12-16 DIAGNOSIS — Z9221 Personal history of antineoplastic chemotherapy: Secondary | ICD-10-CM | POA: Insufficient documentation

## 2015-12-16 DIAGNOSIS — F17210 Nicotine dependence, cigarettes, uncomplicated: Secondary | ICD-10-CM | POA: Insufficient documentation

## 2015-12-16 LAB — COMPREHENSIVE METABOLIC PANEL
ALT: 14 U/L (ref 14–54)
ANION GAP: 9 (ref 5–15)
AST: 18 U/L (ref 15–41)
Albumin: 3.3 g/dL — ABNORMAL LOW (ref 3.5–5.0)
Alkaline Phosphatase: 83 U/L (ref 38–126)
BUN: 62 mg/dL — ABNORMAL HIGH (ref 6–20)
CHLORIDE: 95 mmol/L — AB (ref 101–111)
CO2: 31 mmol/L (ref 22–32)
Calcium: 8.7 mg/dL — ABNORMAL LOW (ref 8.9–10.3)
Creatinine, Ser: 1.5 mg/dL — ABNORMAL HIGH (ref 0.44–1.00)
GFR, EST AFRICAN AMERICAN: 37 mL/min — AB (ref >60)
GFR, EST NON AFRICAN AMERICAN: 32 mL/min — AB (ref >60)
Glucose, Bld: 173 mg/dL — ABNORMAL HIGH (ref 65–99)
POTASSIUM: 4.4 mmol/L (ref 3.5–5.1)
SODIUM: 135 mmol/L (ref 135–145)
Total Bilirubin: 0.7 mg/dL (ref 0.3–1.2)
Total Protein: 7.5 g/dL (ref 6.5–8.1)

## 2015-12-16 LAB — CBC WITH DIFFERENTIAL/PLATELET
Basophils Absolute: 0.1 10*3/uL (ref 0–0.1)
Basophils Relative: 0 %
EOS PCT: 0 %
Eosinophils Absolute: 0 10*3/uL (ref 0–0.7)
HCT: 47 % (ref 35.0–47.0)
Hemoglobin: 15.6 g/dL (ref 12.0–16.0)
Lymphocytes Relative: 1 %
Lymphs Abs: 0.3 10*3/uL — ABNORMAL LOW (ref 1.0–3.6)
MCH: 30.1 pg (ref 26.0–34.0)
MCHC: 33.1 g/dL (ref 32.0–36.0)
MCV: 90.8 fL (ref 80.0–100.0)
MONO ABS: 0.6 10*3/uL (ref 0.2–0.9)
Monocytes Relative: 2 %
Neutro Abs: 28.8 10*3/uL — ABNORMAL HIGH (ref 1.4–6.5)
Neutrophils Relative %: 97 %
PLATELETS: 175 10*3/uL (ref 150–440)
RBC: 5.18 MIL/uL (ref 3.80–5.20)
RDW: 14.2 % (ref 11.5–14.5)
WBC: 29.8 10*3/uL — ABNORMAL HIGH (ref 3.6–11.0)

## 2015-12-16 LAB — MAGNESIUM: MAGNESIUM: 1.7 mg/dL (ref 1.7–2.4)

## 2015-12-16 MED ORDER — IPRATROPIUM-ALBUTEROL 0.5-2.5 (3) MG/3ML IN SOLN: 3.0000 mL | Freq: Three times a day (TID) | RESPIRATORY_TRACT | Status: AC

## 2015-12-16 NOTE — Progress Notes (Signed)
Patient accompanied by her daughters.  They report she has been lethargic for 2 weeks.  Sleeps a lot and is confused.  BP 99/70  HR 115.  O2 - 89% on 2 L O2.  Patient now at [facility].

## 2015-12-17 ENCOUNTER — Emergency Department: Payer: Medicare Other

## 2015-12-17 ENCOUNTER — Other Ambulatory Visit: Payer: Self-pay

## 2015-12-17 ENCOUNTER — Encounter: Payer: Self-pay | Admitting: Emergency Medicine

## 2015-12-17 ENCOUNTER — Inpatient Hospital Stay
Admission: EM | Admit: 2015-12-17 | Discharge: 2015-12-29 | DRG: 871 | Disposition: E | Payer: Medicare Other | Attending: Internal Medicine | Admitting: Internal Medicine

## 2015-12-17 ENCOUNTER — Encounter: Payer: Self-pay | Admitting: Oncology

## 2015-12-17 DIAGNOSIS — I248 Other forms of acute ischemic heart disease: Secondary | ICD-10-CM | POA: Diagnosis present

## 2015-12-17 DIAGNOSIS — Z9221 Personal history of antineoplastic chemotherapy: Secondary | ICD-10-CM | POA: Diagnosis not present

## 2015-12-17 DIAGNOSIS — Z803 Family history of malignant neoplasm of breast: Secondary | ICD-10-CM

## 2015-12-17 DIAGNOSIS — R000 Tachycardia, unspecified: Secondary | ICD-10-CM

## 2015-12-17 DIAGNOSIS — I5032 Chronic diastolic (congestive) heart failure: Secondary | ICD-10-CM | POA: Diagnosis present

## 2015-12-17 DIAGNOSIS — Z66 Do not resuscitate: Secondary | ICD-10-CM | POA: Diagnosis present

## 2015-12-17 DIAGNOSIS — G3184 Mild cognitive impairment, so stated: Secondary | ICD-10-CM | POA: Diagnosis present

## 2015-12-17 DIAGNOSIS — E559 Vitamin D deficiency, unspecified: Secondary | ICD-10-CM | POA: Diagnosis present

## 2015-12-17 DIAGNOSIS — A419 Sepsis, unspecified organism: Secondary | ICD-10-CM | POA: Diagnosis present

## 2015-12-17 DIAGNOSIS — N179 Acute kidney failure, unspecified: Secondary | ICD-10-CM | POA: Diagnosis present

## 2015-12-17 DIAGNOSIS — R0600 Dyspnea, unspecified: Secondary | ICD-10-CM

## 2015-12-17 DIAGNOSIS — Z923 Personal history of irradiation: Secondary | ICD-10-CM

## 2015-12-17 DIAGNOSIS — Z808 Family history of malignant neoplasm of other organs or systems: Secondary | ICD-10-CM

## 2015-12-17 DIAGNOSIS — J440 Chronic obstructive pulmonary disease with acute lower respiratory infection: Secondary | ICD-10-CM | POA: Diagnosis present

## 2015-12-17 DIAGNOSIS — F17210 Nicotine dependence, cigarettes, uncomplicated: Secondary | ICD-10-CM | POA: Diagnosis present

## 2015-12-17 DIAGNOSIS — Y95 Nosocomial condition: Secondary | ICD-10-CM | POA: Diagnosis present

## 2015-12-17 DIAGNOSIS — Z79899 Other long term (current) drug therapy: Secondary | ICD-10-CM

## 2015-12-17 DIAGNOSIS — J690 Pneumonitis due to inhalation of food and vomit: Secondary | ICD-10-CM | POA: Diagnosis not present

## 2015-12-17 DIAGNOSIS — Z91040 Latex allergy status: Secondary | ICD-10-CM

## 2015-12-17 DIAGNOSIS — D45 Polycythemia vera: Secondary | ICD-10-CM

## 2015-12-17 DIAGNOSIS — Z880 Allergy status to penicillin: Secondary | ICD-10-CM | POA: Diagnosis not present

## 2015-12-17 DIAGNOSIS — G9341 Metabolic encephalopathy: Secondary | ICD-10-CM | POA: Diagnosis present

## 2015-12-17 DIAGNOSIS — E875 Hyperkalemia: Secondary | ICD-10-CM | POA: Diagnosis present

## 2015-12-17 DIAGNOSIS — J9622 Acute and chronic respiratory failure with hypercapnia: Secondary | ICD-10-CM | POA: Diagnosis present

## 2015-12-17 DIAGNOSIS — N189 Chronic kidney disease, unspecified: Secondary | ICD-10-CM | POA: Diagnosis present

## 2015-12-17 DIAGNOSIS — J449 Chronic obstructive pulmonary disease, unspecified: Secondary | ICD-10-CM

## 2015-12-17 DIAGNOSIS — J189 Pneumonia, unspecified organism: Secondary | ICD-10-CM | POA: Diagnosis present

## 2015-12-17 DIAGNOSIS — Z888 Allergy status to other drugs, medicaments and biological substances status: Secondary | ICD-10-CM | POA: Diagnosis not present

## 2015-12-17 DIAGNOSIS — E785 Hyperlipidemia, unspecified: Secondary | ICD-10-CM | POA: Diagnosis present

## 2015-12-17 DIAGNOSIS — J9621 Acute and chronic respiratory failure with hypoxia: Secondary | ICD-10-CM | POA: Diagnosis present

## 2015-12-17 DIAGNOSIS — G92 Toxic encephalopathy: Secondary | ICD-10-CM | POA: Diagnosis not present

## 2015-12-17 DIAGNOSIS — D751 Secondary polycythemia: Secondary | ICD-10-CM | POA: Diagnosis present

## 2015-12-17 DIAGNOSIS — E538 Deficiency of other specified B group vitamins: Secondary | ICD-10-CM | POA: Diagnosis present

## 2015-12-17 DIAGNOSIS — C3481 Malignant neoplasm of overlapping sites of right bronchus and lung: Secondary | ICD-10-CM

## 2015-12-17 DIAGNOSIS — E45 Retarded development following protein-calorie malnutrition: Secondary | ICD-10-CM

## 2015-12-17 DIAGNOSIS — R7989 Other specified abnormal findings of blood chemistry: Secondary | ICD-10-CM

## 2015-12-17 DIAGNOSIS — J9691 Respiratory failure, unspecified with hypoxia: Secondary | ICD-10-CM | POA: Diagnosis present

## 2015-12-17 DIAGNOSIS — C3491 Malignant neoplasm of unspecified part of right bronchus or lung: Secondary | ICD-10-CM | POA: Diagnosis present

## 2015-12-17 DIAGNOSIS — D72829 Elevated white blood cell count, unspecified: Secondary | ICD-10-CM

## 2015-12-17 DIAGNOSIS — Z515 Encounter for palliative care: Secondary | ICD-10-CM | POA: Diagnosis present

## 2015-12-17 DIAGNOSIS — J9692 Respiratory failure, unspecified with hypercapnia: Secondary | ICD-10-CM

## 2015-12-17 LAB — COMPREHENSIVE METABOLIC PANEL
ALBUMIN: 2.9 g/dL — AB (ref 3.5–5.0)
ALT: 14 U/L (ref 14–54)
AST: 19 U/L (ref 15–41)
Alkaline Phosphatase: 92 U/L (ref 38–126)
Anion gap: 9 (ref 5–15)
BUN: 69 mg/dL — AB (ref 6–20)
CHLORIDE: 93 mmol/L — AB (ref 101–111)
CO2: 32 mmol/L (ref 22–32)
CREATININE: 1.8 mg/dL — AB (ref 0.44–1.00)
Calcium: 8.7 mg/dL — ABNORMAL LOW (ref 8.9–10.3)
GFR calc Af Amer: 29 mL/min — ABNORMAL LOW (ref >60)
GFR calc non Af Amer: 25 mL/min — ABNORMAL LOW (ref >60)
Glucose, Bld: 167 mg/dL — ABNORMAL HIGH (ref 65–99)
POTASSIUM: 4.8 mmol/L (ref 3.5–5.1)
SODIUM: 134 mmol/L — AB (ref 135–145)
Total Bilirubin: 0.8 mg/dL (ref 0.3–1.2)
Total Protein: 7.5 g/dL (ref 6.5–8.1)

## 2015-12-17 LAB — CBC WITH DIFFERENTIAL/PLATELET
BASOS ABS: 0.5 10*3/uL — AB (ref 0–0.1)
BASOS PCT: 2 %
EOS ABS: 0 10*3/uL (ref 0–0.7)
EOS PCT: 0 %
HCT: 45.8 % (ref 35.0–47.0)
Hemoglobin: 15.2 g/dL (ref 12.0–16.0)
LYMPHS PCT: 1 %
Lymphs Abs: 0.3 10*3/uL — ABNORMAL LOW (ref 1.0–3.6)
MCH: 30.5 pg (ref 26.0–34.0)
MCHC: 33.2 g/dL (ref 32.0–36.0)
MCV: 91.9 fL (ref 80.0–100.0)
Monocytes Absolute: 1 10*3/uL — ABNORMAL HIGH (ref 0.2–0.9)
Monocytes Relative: 3 %
Neutro Abs: 31.7 10*3/uL — ABNORMAL HIGH (ref 1.4–6.5)
Neutrophils Relative %: 94 %
PLATELETS: 176 10*3/uL (ref 150–440)
RBC: 4.98 MIL/uL (ref 3.80–5.20)
RDW: 14.1 % (ref 11.5–14.5)
WBC: 33.5 10*3/uL — AB (ref 3.6–11.0)

## 2015-12-17 LAB — BLOOD GAS, ARTERIAL
Acid-Base Excess: 2.8 mmol/L (ref 0.0–3.0)
BICARBONATE: 31 meq/L — AB (ref 21.0–28.0)
Delivery systems: POSITIVE
Expiratory PAP: 5
FIO2: 45
INSPIRATORY PAP: 16
PO2 ART: 22 mmHg — AB (ref 83.0–108.0)
Patient temperature: 37
RATE: 12 resp/min
pCO2 arterial: 63 mmHg — ABNORMAL HIGH (ref 32.0–48.0)
pH, Arterial: 7.3 — ABNORMAL LOW (ref 7.350–7.450)

## 2015-12-17 LAB — BRAIN NATRIURETIC PEPTIDE: B Natriuretic Peptide: 645 pg/mL — ABNORMAL HIGH (ref 0.0–100.0)

## 2015-12-17 LAB — LACTIC ACID, PLASMA: Lactic Acid, Venous: 2 mmol/L (ref 0.5–2.0)

## 2015-12-17 LAB — TROPONIN I: Troponin I: 0.12 ng/mL — ABNORMAL HIGH (ref <0.031)

## 2015-12-17 MED ORDER — ONDANSETRON HCL 4 MG/2ML IJ SOLN: 4.0000 mg | Freq: Four times a day (QID) | INTRAMUSCULAR | Status: DC | PRN

## 2015-12-17 MED ORDER — IPRATROPIUM-ALBUTEROL 0.5-2.5 (3) MG/3ML IN SOLN: 3.0000 mL | RESPIRATORY_TRACT | Status: DC | PRN

## 2015-12-17 MED ORDER — SODIUM CHLORIDE 0.9% FLUSH
3.0000 mL | INTRAVENOUS | Status: DC | PRN
Start: 2015-12-17 — End: 2015-12-17

## 2015-12-17 MED ORDER — GLYCOPYRROLATE 0.2 MG/ML IJ SOLN: 0.1000 mg | Freq: Four times a day (QID) | INTRAMUSCULAR | Status: DC | PRN

## 2015-12-17 MED ORDER — SODIUM CHLORIDE 0.9% FLUSH: 3.0000 mL | Freq: Two times a day (BID) | INTRAVENOUS | Status: DC

## 2015-12-17 MED ORDER — HEPARIN SODIUM (PORCINE) 5000 UNIT/ML IJ SOLN: 5000.0000 [IU] | Freq: Three times a day (TID) | INTRAMUSCULAR | Status: DC

## 2015-12-17 MED ORDER — PANTOPRAZOLE SODIUM 40 MG PO TBEC: 40.0000 mg | DELAYED_RELEASE_TABLET | Freq: Every day | ORAL | Status: DC

## 2015-12-17 MED ORDER — ONDANSETRON HCL 4 MG PO TABS: 4.0000 mg | ORAL_TABLET | Freq: Four times a day (QID) | ORAL | Status: DC | PRN

## 2015-12-17 MED ORDER — FLUTICASONE PROPIONATE 50 MCG/ACT NA SUSP
2.0000 | Freq: Every day | NASAL | Status: DC
  Filled 2015-12-17: qty 16

## 2015-12-17 MED ORDER — NICOTINE 14 MG/24HR TD PT24: 14.0000 mg | MEDICATED_PATCH | Freq: Every day | TRANSDERMAL | Status: DC

## 2015-12-17 MED ORDER — SODIUM CHLORIDE 0.9 % IV BOLUS (SEPSIS)
1000.0000 mL | Freq: Once | INTRAVENOUS | Status: AC
  Administered 2015-12-17: 1000 mL via INTRAVENOUS

## 2015-12-17 MED ORDER — MORPHINE SULFATE (PF) 2 MG/ML IV SOLN
2.0000 mg | INTRAVENOUS | Status: DC | PRN
  Administered 2015-12-17: 18:00:00 2 mg via INTRAVENOUS
  Filled 2015-12-17: qty 1

## 2015-12-17 MED ORDER — MORPHINE 100MG IN NS 100ML (1MG/ML) PREMIX INFUSION
3.0000 mg/h | INTRAVENOUS | Status: DC
  Administered 2015-12-17: 18:00:00 3 mg/h via INTRAVENOUS
  Filled 2015-12-17: qty 100

## 2015-12-17 MED ORDER — SODIUM CHLORIDE 0.9 % IV BOLUS (SEPSIS)
500.0000 mL | Freq: Once | INTRAVENOUS | Status: AC
  Administered 2015-12-17: 500 mL via INTRAVENOUS

## 2015-12-17 MED ORDER — HYDROCODONE-ACETAMINOPHEN 5-325 MG PO TABS: 0.5000 | ORAL_TABLET | Freq: Four times a day (QID) | ORAL | Status: DC | PRN

## 2015-12-17 MED ORDER — IPRATROPIUM-ALBUTEROL 0.5-2.5 (3) MG/3ML IN SOLN
3.0000 mL | Freq: Once | RESPIRATORY_TRACT | Status: DC
  Filled 2015-12-17: qty 3

## 2015-12-17 MED ORDER — IPRATROPIUM-ALBUTEROL 0.5-2.5 (3) MG/3ML IN SOLN
3.0000 mL | Freq: Three times a day (TID) | RESPIRATORY_TRACT | Status: DC
  Administered 2015-12-17: 15:00:00 3 mL via RESPIRATORY_TRACT
  Filled 2015-12-17: qty 3

## 2015-12-17 MED ORDER — GLYCOPYRROLATE 0.2 MG/ML IJ SOLN
0.1000 mg | Freq: Four times a day (QID) | INTRAMUSCULAR | Status: DC | PRN
  Administered 2015-12-17: 0.1 mg via INTRAVENOUS
  Filled 2015-12-17: qty 1

## 2015-12-17 MED ORDER — LEVOFLOXACIN IN D5W 750 MG/150ML IV SOLN
750.0000 mg | Freq: Once | INTRAVENOUS | Status: AC
  Administered 2015-12-17: 750 mg via INTRAVENOUS
  Filled 2015-12-17: qty 150

## 2015-12-17 MED ORDER — DEXTROSE 5 % IV SOLN
2.0000 g | Freq: Once | INTRAVENOUS | Status: AC
  Administered 2015-12-17: 2 g via INTRAVENOUS
  Filled 2015-12-17: qty 2

## 2015-12-17 MED ORDER — ACETAMINOPHEN 325 MG PO TABS: 650.0000 mg | ORAL_TABLET | Freq: Four times a day (QID) | ORAL | Status: DC | PRN

## 2015-12-17 MED ORDER — PROCHLORPERAZINE 25 MG RE SUPP
25.0000 mg | Freq: Three times a day (TID) | RECTAL | Status: DC | PRN
  Filled 2015-12-17: qty 1

## 2015-12-17 MED ORDER — BISACODYL 10 MG RE SUPP: 10.0000 mg | Freq: Every day | RECTAL | Status: DC | PRN

## 2015-12-17 MED ORDER — ALPRAZOLAM 0.25 MG PO TABS: 0.2500 mg | ORAL_TABLET | Freq: Two times a day (BID) | ORAL | Status: DC | PRN

## 2015-12-17 MED ORDER — ENSURE ENLIVE PO LIQD: 237.0000 mL | Freq: Two times a day (BID) | ORAL | Status: DC

## 2015-12-17 MED ORDER — AZELASTINE HCL 0.1 % NA SOLN
1.0000 | Freq: Two times a day (BID) | NASAL | Status: DC | PRN
  Filled 2015-12-17: qty 30

## 2015-12-17 MED ORDER — LORAZEPAM 2 MG/ML IJ SOLN: 0.5000 mg | INTRAMUSCULAR | Status: DC | PRN

## 2015-12-17 MED ORDER — ACETAMINOPHEN 650 MG RE SUPP: 650.0000 mg | Freq: Four times a day (QID) | RECTAL | Status: DC | PRN

## 2015-12-17 MED ORDER — VANCOMYCIN HCL IN DEXTROSE 1-5 GM/200ML-% IV SOLN
1000.0000 mg | Freq: Once | INTRAVENOUS | Status: AC
  Administered 2015-12-17: 1000 mg via INTRAVENOUS
  Filled 2015-12-17: qty 200

## 2015-12-27 LAB — CULTURE, BLOOD (ROUTINE X 2)
Culture: NO GROWTH
Culture: NO GROWTH
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
## 2015-12-29 NOTE — Progress Notes (Signed)
Assessed  pt 02 sats 90 % called RT to help with pt's sats level

## 2015-12-29 NOTE — ED Notes (Signed)
Pt ems from [facility] for SOB. Per ems pt was 70% on 3LNC at [facility]. Placed on c-pap by EMS with best sat 90%. ST on monitor. Hx CHF and recent DX of pneumonia.

## 2015-12-29 NOTE — Progress Notes (Signed)
Patient passed away at 2024, MD notified. [facility] notified, pt is not qualified to be a donor, okay to release to funeral home. Nursing Supervisor [place] notified. Family at bedside.

## 2015-12-29 NOTE — Consult Note (Addendum)
Palliative Medicine Inpatient Consult Note   Name: Jacqueline Zamora Date: 2016-01-06 MRN: [id number]  DOB: [birth year]  Referring Physician: Max Sane, MD  Palliative Care consult requested for this 82 y.o. 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    RECOMMENDATIONS AND PLAN: Pt is actively dying at the time I see her.  There were several orders for a Pallaitive Care Consult --but these were discontinued when several on the care team thought pt would be going to [facility] ---and would therefore not need Palliative Care involvement.   Choice of Hospices had been presented to family by care manager --but Dr. Metro Kung office had already requested [facility] -- and family was asking for [facility] [place] and specifically for Hospice Home.   There was not an available Hospice Home bed this am when pt presented to the ED, and though a bed has become available very late in the day, pt is now much worse and not appropriate at this time for transfer out of hospital.    I saw pt before knowing the 4 consult orders were all DC'd (working off my printed list from earlier in the day).  Pt was noted to be in early respiratory distress. She had declined drastically from earlier per family report. At first she was talking --but now she is obtunded and she has bronchial and tracheal rattling, tachypnea, and mottling to above her knees.  She is sweaty, pale, clammy, and grimacing (before morphine).  She appears to be dying imminently.    Her two daughters request comfort care here and now.  Pt will be started on a Morphine drip.  While we wait for that, she is getting doses of morphine 2 mg as needed every hour prn.     1.  I have completed DNR form for paper chart.   2.  I have seen pt twice and talked with pts daughters' twice.  I have spoken with family friends in the room and  provided supportive conversations.  Pt appears much more comfortable after the 2 mg of morphine was given.  Family is appreciative of the urgent interventions.  Pt is deemed not stable enough for transfer to Hospice at this time --due to severe respiratory distress. Sats are in the mid to high 80's on 5-6 LPM oxygen. She will not be placed on hi flow or bipap or be intubated.  We will maintain comfort with morphine.  I explained how morphine helps reduce the sensation of smothering and family and friends understand why it is needed.    3.  I have updated nursing, attending (Dr Manuella Ghazi --who agrees with keeping pt here at this time), and Hospice Liaison nurse.  Dr Manuella Ghazi agrees with a new order for Palliative Care consult since pt is not going to go to [facility] this pm.    4. Should pt survive the night, we will see how she is in the morning.  Family agrees with this plan of care.    NARRATIVE HISTORY: Pt is an 82 yo woman who presented from [facility] after a recent discharge from this hospital for respiratory failure.  She is chronically on Oxygen and is DNR status.  She had had sepsis due to pneumonia and COPD exacerbation during recent hospital stay from 4/6 - 4/16.  She was hypoxic at that facility and was returned on this date, 3 days later.  Pt was  obtunded in the ED and family made decision for pt to be comfort care.     IMPRESSION Recurrent Small Cell Lung Cancer (right side) ---h/o external radiation Acute on Chronic Resp Failure with hypoxemia Recent Community Acq Pneumonia COPD with exacerbations (followed by Dr. Raul Del) Chronic diastolic CHF Recent Klebsiella UTI Hypomagnesemia --chronic Mild cognitive impairment Ongoing Tobacco use Sinus tachycardia H/o hypercalcemia B12 Deficiency Vit D Deficienty Polycythemia (HGB is 15.2) Hyperkalemia Mildly elevated troponin --due to demand ischemia Severe Leukocytosis (WBC at 33.5)   REVIEW OF SYSTEMS:  Patient is not able to  provide ROS due to illness  SPIRITUAL SUPPORT SYSTEM: Yes.  SOCIAL HISTORY:  reports that she has been smoking Cigarettes.  She has a 62 pack-year smoking history. She has never used smokeless tobacco. She reports that she does not drink alcohol or use illicit drugs.  LEGAL DOCUMENTS:    CODE STATUS: DNR  PAST MEDICAL HISTORY: Past Medical History  Diagnosis Date  . COPD (chronic obstructive pulmonary disease) ([place])   . Hypercalcemia   . Polycythemia   . Hyperlipidemia   . Vitamin D deficiency   . Vitamin B12 deficiency   . Pulmonary nodule   . Cancer ([place])     Small Cell Lung Cancer ([place])  . Pneumonia     PAST SURGICAL HISTORY:  Past Surgical History  Procedure Laterality Date  . Tonsillectomy    . Colon surgery      Sigmoid Colectomy  . Cataract extraction    . Eye surgery      Cataract Extraction 2008  . Colonoscopy with propofol N/A 05/02/2015    Procedure: COLONOSCOPY WITH PROPOFOL;  Surgeon: Hulen Luster, MD;  Location: [facility] ENDOSCOPY;  Service: Gastroenterology;  Laterality: N/A;  . Esophagogastroduodenoscopy (egd) with propofol N/A 05/02/2015    Procedure: ESOPHAGOGASTRODUODENOSCOPY (EGD) WITH PROPOFOL;  Surgeon: Hulen Luster, MD;  Location: [facility] ENDOSCOPY;  Service: Gastroenterology;  Laterality: N/A;    ALLERGIES:  is allergic to iodine; latex; neomycin-bacitracin zn-polymyx; and penicillins.  MEDICATIONS:  Current Facility-Administered Medications  Medication Dose Route Frequency Provider Last Rate Last Dose  . acetaminophen (TYLENOL) suppository 650 mg  650 mg Rectal Q6H PRN Theodoro Grist, MD      . bisacodyl (DULCOLAX) suppository 10 mg  10 mg Rectal Daily PRN Colleen Can, MD      . ipratropium-albuterol (DUONEB) 0.5-2.5 (3) MG/3ML nebulizer solution 3 mL  3 mL Nebulization Q4H PRN Colleen Can, MD      . LORazepam (ATIVAN) injection 0.5 mg  0.5 mg Intravenous Q2H PRN Colleen Can, MD      . morphine '100mg'$  in NS 182m ('1mg'$ /mL) infusion -  premix  3 mg/hr Intravenous Continuous MColleen Can MD 3 mL/hr at 024-Apr-20171824 3 mg/hr at 004/24/171824  . morphine 2 MG/ML injection 2 mg  2 mg Intravenous Q1H PRN RTheodoro Grist MD   2 mg at 02017/04/241738  . ondansetron (ZOFRAN) injection 4 mg  4 mg Intravenous Q6H PRN RTheodoro Grist MD      . prochlorperazine (COMPAZINE) suppository 25 mg  25 mg Rectal Q8H PRN MColleen Can MD       Facility-Administered Medications Ordered in Other Encounters  Medication Dose Route Frequency Provider Last Rate Last Dose  . sodium chloride 0.9 % injection 10 mL  10 mL Intracatheter PRN JForest Gleason MD   10 mL at 04/17/15 1339    Vital Signs: BP 115/62 mmHg  Pulse 117  Temp(Src) 97.8 F (  36.6 C) (Oral)  Resp 16  Ht '5\' 3"'$  (1.6 m)  Wt 63.866 kg (140 lb 12.8 oz)  BMI 24.95 kg/m2  SpO2 88% Filed Weights   2015/12/31 0813 12-31-2015 1215  Weight: 49.896 kg (110 lb) 63.866 kg (140 lb 12.8 oz)    Estimated body mass index is 24.95 kg/(m^2) as calculated from the following:   Height as of this encounter: '5\' 3"'$  (1.6 m).   Weight as of this encounter: 63.866 kg (140 lb 12.8 oz).  PERFORMANCE STATUS (ECOG) : 4 - Bedbound  PHYSICAL EXAM: Pt is lying in bed with eyes partly open --though she is unresponsive now ---she has loud bronchial and tracheal rattles audible from the doorway to her room She has a prominent grimaced facial expression OP looks dry Neck no JVD or TM hrt rrr tachy --distant Abd soft and NT Ext no mottling but she is sweating and clammy and cool all over Pulses are thready but palpable in radials bilat.  She does not talk now --though she was up and talking two hours ago per family--they say she has gradually gotten much worse.   LABS: CBC:    Component Value Date/Time   WBC 33.5* Dec 31, 2015 0745   WBC 3.8 12/19/2014 1416   HGB 15.2 2015/12/31 0745   HGB 13.1 12/19/2014 1416   HCT 45.8 31-Dec-2015 0745   HCT 39.0 12/19/2014 1416   PLT 176 31-Dec-2015 0745   PLT  199 12/19/2014 1416   MCV 91.9 2015/12/31 0745   MCV 93 12/19/2014 1416   NEUTROABS 31.7* 12/31/15 0745   NEUTROABS 2.3 12/19/2014 1416   LYMPHSABS 0.3* 2015-12-31 0745   LYMPHSABS 0.9* 12/19/2014 1416   MONOABS 1.0* Dec 31, 2015 0745   MONOABS 0.3 12/19/2014 1416   EOSABS 0.0 12/31/2015 0745   EOSABS 0.1 12/19/2014 1416   BASOSABS 0.5* 2015/12/31 0745   BASOSABS 0.1 12/19/2014 1416   Comprehensive Metabolic Panel:    Component Value Date/Time   NA 134* 2015-12-31 0745   NA 139 12/19/2014 1416   K 4.8 12-31-2015 0745   K 3.6 12/19/2014 1416   CL 93* 2015-12-31 0745   CL 106 12/19/2014 1416   CO2 32 12-31-15 0745   CO2 26 12/19/2014 1416   BUN 69* 12-31-2015 0745   BUN 18 12/19/2014 1416   CREATININE 1.80* Dec 31, 2015 0745   CREATININE 0.92 12/19/2014 1416   GLUCOSE 167* 31-Dec-2015 0745   GLUCOSE 97 12/19/2014 1416   CALCIUM 8.7* Dec 31, 2015 0745   CALCIUM 8.9 12/19/2014 1416   AST 19 2015-12-31 0745   AST 18 12/19/2014 1416   ALT 14 12/31/15 0745   ALT 12* 12/19/2014 1416   ALKPHOS 92 Dec 31, 2015 0745   ALKPHOS 76 12/19/2014 1416   BILITOT 0.8 12/31/2015 0745   BILITOT 0.5 12/19/2014 1416   PROT 7.5 Dec 31, 2015 0745   PROT 6.8 12/19/2014 1416   ALBUMIN 2.9* 12-31-15 0745   ALBUMIN 3.6 12/19/2014 1416    More than 50% of the visit was spent in counseling/coordination of care: Yes  Time Spent: 115 minutes

## 2015-12-29 NOTE — Progress Notes (Signed)
   01-02-16 1037  Oxygen Therapy/Pulse Ox  O2 Device Nasal Cannula  O2 Flow Rate (L/min) 2 L/min  SpO2 97 %  Removed patient from bipap and placed  on 2lpm nasal canula per MD.  Patient is tolerating well. RN aware.

## 2015-12-29 NOTE — Clinical Social Work Note (Signed)
Pt expired overnight. CSW is signing off as no further needs identified.   Darden Dates, MSW, LCSW Clinical Social Worker  [phone]

## 2015-12-29 NOTE — Progress Notes (Signed)
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 T1, N2, M0 tumor stage III Diagnoses by bronchoscopy in June of 2015 2.  Chronic tobacco abuse and long-term COPD 3.started chemotherapy with cis-platinum VP-16 June of 2015 . with concurrent radiation treatment 4.last chemotherapy was on September 16,2015   finished radiation therapy by end of September 2015.Marland Kitchen Prophylactic brain radiation has been discontinued because of progressive weakness in both lower extremity (November, 2015)    2.  CT scan of December, 2016 shows progressive disease.   INTERVAL HISTORY: [name]  condition has been slowly declining.  Increasing cough and shortness of breath Patient's condition has been declining over last 2 months Has increasing shortness of breath.  Patient is getting more and more lethargic.  Getting more and more out of breath.  Remains in the wheelchair.  Performance status is declining.  Patient had been hospitalized recently with diagnosis of pneumonia however there was no significant improvement with IV antibiotics patient was transferred to rehabilitation and for last 1 week patient is undergoing physiotherapy without any significant improvement.  Patient was brought here somewhat lethargic.  Hypoxic even on 3 L of oxygen. On very communicative.  Patient's family is present  Previous CT scan in December was showing progressive disease  REVIEW OF SYSTEMS:    general status:    declining performance statusNo chills.  No fever.  Getting more and more lethargic.  Lack of energy.  Poor appetite. HEENT: Alopecia.  No evidence of stomatitis Lungs: Increasing shortness of breath.  Hypoxic even at rest on oxygen. Lower chest wall  area pain is described about Continues to smoke Cardiac: No chest pain or paroxysmal nocturnal dyspnea GI:  anal in epigastric area constant dull achingin: No rash Lower extremity no swelling Neurological system: No tingling.  No numbness.  No other focal signs Musculoskeletal system no bony pains Has not lost any weight.  Appetite has been stable. As per HPI. Otherwise, a complete review of systems is negatve.   Significant History/PMH:   post menopausal:    polycythemia:    hypercalcemia:    lung cancer:    Pulmonary nodule:    Hyperlipidemia:    Colon or Rectal Cancer:    COPD:    Asthma:    Tonsillectomy:    Cataract Extraction:    Colon Resection:   Smoking History: Smoking History 0.5 Packs per day and has received prescription for chantix. Smoking Cessation Information Given to Patient .  PFSH: Comments: h/o of lung cancer or melanoma and thyroid cancer in the family  Social History: negative alcohol  Additional Past Medical and Surgical History: no significant history of any other medical illness  In 2004 patient had carcinoma of colon underwent surgery and no followup therapy    ADVANCED DIRECTIVES:  Patient does have advance healthcare directive, Patient   does not desire to make any changes  HEALTH MAINTENANCE: Social History  Substance Use Topics  . Smoking status: Current Every Day Smoker -- 1.00 packs/day for 62 years    Types: Cigarettes  . Smokeless tobacco: Never Used  . Alcohol Use: No      Allergies  Allergen Reactions  . Iodine Rash  . Latex Rash  . Neomycin-Bacitracin  Zn-Polymyx Rash  . Penicillins Rash and Other (See Comments)    Family cannot answer follow-up questions    Current Outpatient Prescriptions  Medication Sig Dispense Refill  . albuterol (PROVENTIL HFA;VENTOLIN HFA) 108 (90 Base) MCG/ACT inhaler Inhale 2 puffs into the lungs every 6 (six) hours as needed for wheezing or shortness of breath.    . ALPRAZolam  (XANAX) 0.25 MG tablet Take 1 tablet (0.25 mg total) by mouth 2 (two) times daily as needed for anxiety. 60 tablet 1  . azelastine (ASTELIN) 0.1 % nasal spray Place 1 spray into both nostrils 2 (two) times daily as needed for rhinitis or allergies. Use in each nostril as directed    . cholecalciferol (VITAMIN D) 1000 units tablet Take 1,000 Units by mouth daily.    . cyanocobalamin 500 MCG tablet Take 500 mcg by mouth daily.    . feeding supplement, ENSURE ENLIVE, (ENSURE ENLIVE) LIQD Take 237 mLs by mouth 2 (two) times daily between meals. 237 mL 12  . fluticasone (FLONASE) 50 MCG/ACT nasal spray Place 2 sprays into the nose daily as needed for allergies or rhinitis.     . Fluticasone-Salmeterol (ADVAIR DISKUS) 250-50 MCG/DOSE AEPB Inhale 1 puff into the lungs 2 (two) times daily.    Marland Kitchen HYDROcodone-acetaminophen (NORCO/VICODIN) 5-325 MG tablet Take 0.5-1 tablets by mouth every 6 (six) hours as needed for moderate pain.    . metoprolol tartrate (LOPRESSOR) 25 MG tablet Take 1 tablet (25 mg total) by mouth 2 (two) times daily. 60 tablet 0  . nicotine (NICODERM CQ - DOSED IN MG/24 HOURS) 14 mg/24hr patch Place 1 patch (14 mg total) onto the skin daily. 28 patch 0  . omeprazole (PRILOSEC) 20 MG capsule Take 1 capsule (20 mg total) by mouth 2 (two) times daily before a meal. 60 capsule 3  . predniSONE (DELTASONE) 10 MG tablet Take 1 tablet (10 mg total) by mouth daily with breakfast. Start at 50 mg daily for 2 days taper by 10 mg every 2 days when at 10 mg daily take this indefinetley as you are at home. 20 tablet 0  . predniSONE (DELTASONE) 5 MG tablet Take 2 tablets (10 mg total) by mouth daily with breakfast. Patient will take prednisone taper then resume daily 10 mg dose 30 tablet 0  . ipratropium-albuterol (DUONEB) 0.5-2.5 (3) MG/3ML SOLN Take 3 mLs by nebulization 3 (three) times daily. 360 mL 3   No current facility-administered medications for this visit.   Facility-Administered Medications  Ordered in Other Visits  Medication Dose Route Frequency Provider Last Rate Last Dose  . sodium chloride 0.9 % injection 10 mL  10 mL Intracatheter PRN Forest Gleason, MD   10 mL at 04/17/15 1339    OBJECTIVE:  Filed Vitals:   12/16/15 1424  BP: 99/70  Pulse: 115  Temp: 97.6 F (36.4 C)  Resp: 18     Body mass index is 20.02 kg/(m^2).    ECOG FS:1 - Symptomatic but completely ambulatory  PHYSICAL EXAM: GENERAL status: Declining performance status.  Patient is in wheelchair. Poorly communicative. HEENT: Difficult to examine.  No soreness in mouth Lungs: Bilateral emphysematous chest wheezing bilaterally Cardiac: Tachycardia Abdomen: Soft tenderness in epigastric area Lower extremity swelling 1+ Skin: Poor skin turgor Neurological system: Patient is somewhat lethargic poorly communicative and no other focal signs.  Higher functions difficult to evaluate\ Patient is on oxygen in wheelchair all other systems avidity examined   LAB RESULTS:  Appointment on 12/16/2015  Component Date Value  Ref Range Status  . WBC 12/16/2015 29.8* 3.6 - 11.0 K/uL Final  . RBC 12/16/2015 5.18  3.80 - 5.20 MIL/uL Final  . Hemoglobin 12/16/2015 15.6  12.0 - 16.0 g/dL Final  . HCT 12/16/2015 47.0  35.0 - 47.0 % Final  . MCV 12/16/2015 90.8  80.0 - 100.0 fL Final  . MCH 12/16/2015 30.1  26.0 - 34.0 pg Final  . MCHC 12/16/2015 33.1  32.0 - 36.0 g/dL Final  . RDW 12/16/2015 14.2  11.5 - 14.5 % Final  . Platelets 12/16/2015 175  150 - 440 K/uL Final  . Neutrophils Relative % 12/16/2015 97   Final  . Neutro Abs 12/16/2015 28.8* 1.4 - 6.5 K/uL Final  . Lymphocytes Relative 12/16/2015 1   Final  . Lymphs Abs 12/16/2015 0.3* 1.0 - 3.6 K/uL Final  . Monocytes Relative 12/16/2015 2   Final  . Monocytes Absolute 12/16/2015 0.6  0.2 - 0.9 K/uL Final  . Eosinophils Relative 12/16/2015 0   Final  . Eosinophils Absolute 12/16/2015 0.0  0 - 0.7 K/uL Final  . Basophils Relative 12/16/2015 0   Final  . Basophils  Absolute 12/16/2015 0.1  0 - 0.1 K/uL Final  . Sodium 12/16/2015 135  135 - 145 mmol/L Final  . Potassium 12/16/2015 4.4  3.5 - 5.1 mmol/L Final  . Chloride 12/16/2015 95* 101 - 111 mmol/L Final  . CO2 12/16/2015 31  22 - 32 mmol/L Final  . Glucose, Bld 12/16/2015 173* 65 - 99 mg/dL Final  . BUN 12/16/2015 62* 6 - 20 mg/dL Final  . Creatinine, Ser 12/16/2015 1.50* 0.44 - 1.00 mg/dL Final  . Calcium 12/16/2015 8.7* 8.9 - 10.3 mg/dL Final  . Total Protein 12/16/2015 7.5  6.5 - 8.1 g/dL Final  . Albumin 12/16/2015 3.3* 3.5 - 5.0 g/dL Final  . AST 12/16/2015 18  15 - 41 U/L Final  . ALT 12/16/2015 14  14 - 54 U/L Final  . Alkaline Phosphatase 12/16/2015 83  38 - 126 U/L Final  . Total Bilirubin 12/16/2015 0.7  0.3 - 1.2 mg/dL Final  . GFR calc non Af Amer 12/16/2015 32* >60 mL/min Final  . GFR calc Af Amer 12/16/2015 37* >60 mL/min Final   Comment: (NOTE) The eGFR has been calculated using the CKD EPI equation. This calculation has not been validated in all clinical situations. eGFR's persistently <60 mL/min signify possible Chronic Kidney Disease.   . Anion gap 12/16/2015 9  5 - 15 Final  . Magnesium 12/16/2015 1.7  1.7 - 2.4 mg/dL Final   Oncology History         ASSESSMENT: Small cell undifferentiated carcinoma of lung  Patient has been recently hospitalized all the records have been reviewed chest x-ray has been reviewed.  Previous CT scan in December was showing progressive disease.  Patient has bilateral wheezing.  Hypoxic.  I had prolonged discussion with patient and family presently in rehabilitation without making any difference in terms of improvement I had following suggestion to the patient and family As we had discussed in December consider hospice care.  Consider hospice care at home versus hospice home.  At present time on the daughter desires to take patient home and try hospice care at home however other one Harlene Salts is going to be difficult to take care of for at  home. They are still slightly optimistic that continuing physical therapy might be helpful but they understand that overall prognosis is poor.  I have referred this patient to  hospice for them to evaluate if within 1 week with continuing physiotherapy patient does not make any significant progress patient may be a candidate for hospice home with very limited prognosis. The patient make some improvement with physiotherapy then patient can be taken care of at home with the help of hospice and can be kept at home as long as possible I do not expect any meaningful recovery at present time Continuing bilateral wheezing will start patient on duonab nebulizer Continue  Prednisone. Total duration of visit was 45 minutes.  50% or more time was spent in counseling patient and family regarding prognosis and options of treatment and available resources     Reevaluate patient in 6 weeks with repeat blood count and magnesium level  Patient expressed understanding and was in agreement with this plan. She also understands that She can call clinic at any time with any questions, concerns, or complaints.    No matching staging information was found for the patient.  Forest Gleason, MD   12/31/2015 7:23 AM

## 2015-12-29 NOTE — Discharge Summary (Signed)
[facility] NAME: Jacqueline Zamora    MR#:  [id number]  DATE OF BIRTH:  Apr 23, 1933  DATE OF ADMISSION:  2015/12/26 ADMITTING PHYSICIAN: Theodoro Grist, MD  DATE OF DISCHARGE: Dec 26, 2015  8:24 PM  PRIMARY CARE PHYSICIAN: Tama High III, MD     ADMISSION DIAGNOSIS:  HCAP (healthcare-associated pneumonia) [J18.9] Acute on chronic respiratory failure with hypoxia ([place]) [J96.21] Sepsis due to pneumonia ([place]) [J18.9, A41.9]  DISCHARGE DIAGNOSIS:  Active Problems:   Respiratory failure with hypoxia and hypercapnia ([place])   SECONDARY DIAGNOSIS:   Past Medical History  Diagnosis Date  . COPD (chronic obstructive pulmonary disease) ([place])   . Hypercalcemia   . Polycythemia   . Hyperlipidemia   . Vitamin D deficiency   . Vitamin B12 deficiency   . Pulmonary nodule   . Cancer ([place])     Small Cell Lung Cancer ([place])  . Pneumonia     .pro HOSPITAL COURSE:   Nikesha Kwasny is a 82 y.o. female with a known history of recurrent right lung small cell lung cancer, COPD, tobacco abuse, polycythemia who was brought to emergency room for respiratory distress, shortness of breath and hypoxia in the facility. She was recently discharged from the hospital for community-acquired pneumonia and COPD exacerbation on levofloxacin. She was noted to be short of breath and hypoxic in facility. On EMS arrival, she was placed on CPAP, initial O2 saturations were in 70s. Jacqueline Zamora had no fevers, vomiting, diarrhea or chest pains, per emergency room physician's note. Jacqueline Zamora herself was not able to provide history since she was obtunded. She was seen on BiPAP in ER,  denied any pain or discomfort. Her family make decision about comfort care measures only. Jacqueline Zamora was admitted to the hospital for comfort care , since hospice home bed was not available and expired shortly after. Discussion by problem: #1. Acute on chronic respiratory failure with hypoxia and  hypercapnia, Jacqueline Zamora was initiated comfort care per family request,  supportive care was provided and she died shortly after admission. #2. right sided pneumonia, Jacqueline Zamora's family decided against therapy due to overall unfavorable outcomes, supportive therapy was provided, palliative care consulted, Jacqueline Zamora was administered Morphine, Oxygen for comfort.  #3. Acute on chronic renal failure, Jacqueline Zamora was given IV fluids while in the emergency room, no further labs were drawn due to comfort care measures #4. Leukocytosis, due to sepsis, no labs were checked due to comfort care measures DISCHARGE CONDITIONS:   Jacqueline Zamora died  CONSULTS OBTAINED:     DRUG ALLERGIES:   Allergies  Allergen Reactions  . Iodine Rash  . Latex Rash  . Neomycin-Bacitracin Zn-Polymyx Rash  . Penicillins Rash and Other (See Comments)    Family cannot answer follow-up questions    DISCHARGE MEDICATIONS:   Discharge Medication List as of 12/26/15 11:06 PM    CONTINUE these medications which have NOT CHANGED   Details  albuterol (PROVENTIL HFA;VENTOLIN HFA) 108 (90 Base) MCG/ACT inhaler Inhale 2 puffs into the lungs every 6 (six) hours as needed for wheezing or shortness of breath., Until Discontinued, Historical Med    ALPRAZolam (XANAX) 0.25 MG tablet Take 1 tablet (0.25 mg total) by mouth 2 (two) times daily as needed for anxiety., Starting 09/15/2015, Until Discontinued, Print    azelastine (ASTELIN) 0.1 % nasal spray Place 1 spray into both nostrils 2 (two) times daily as needed for rhinitis or allergies. Use in each nostril as directed, Until Discontinued, Historical Med  cholecalciferol (VITAMIN D) 1000 units tablet Take 1,000 Units by mouth daily., Until Discontinued, Historical Med    cyanocobalamin 500 MCG tablet Take 500 mcg by mouth daily., Until Discontinued, Historical Med    feeding supplement, ENSURE ENLIVE, (ENSURE ENLIVE) LIQD Take 237 mLs by mouth 2 (two) times daily between meals., Starting  12/11/2015, Until Discontinued, Normal    fluticasone (FLONASE) 50 MCG/ACT nasal spray Place 2 sprays into the nose daily as needed for allergies or rhinitis. , Starting 01/15/2015, Until Thu 01/15/16, Historical Med    Fluticasone-Salmeterol (ADVAIR DISKUS) 250-50 MCG/DOSE AEPB Inhale 1 puff into the lungs 2 (two) times daily., Until Discontinued, Historical Med    HYDROcodone-acetaminophen (NORCO/VICODIN) 5-325 MG tablet Take 0.5-1 tablets by mouth every 6 (six) hours as needed for moderate pain., Until Discontinued, Historical Med    ipratropium-albuterol (DUONEB) 0.5-2.5 (3) MG/3ML SOLN Take 3 mLs by nebulization 3 (three) times daily., Starting 12/16/2015, Until Discontinued, Print    metoprolol tartrate (LOPRESSOR) 25 MG tablet Take 1 tablet (25 mg total) by mouth 2 (two) times daily., Starting 12/11/2015, Until Discontinued, Normal    mirtazapine (REMERON) 7.5 MG tablet Take 7.5 mg by mouth at bedtime., Until Discontinued, Historical Med    nicotine (NICODERM CQ - DOSED IN MG/24 HOURS) 14 mg/24hr patch Place 1 patch (14 mg total) onto the skin daily., Starting 12/11/2015, Until Discontinued, Normal    omeprazole (PRILOSEC) 20 MG capsule Take 1 capsule (20 mg total) by mouth 2 (two) times daily before a meal., Starting 08/18/2015, Until Discontinued, Normal    !! predniSONE (DELTASONE) 10 MG tablet Take 1 tablet (10 mg total) by mouth daily with breakfast. Start at 50 mg daily for 2 days taper by 10 mg every 2 days when at 10 mg daily take this indefinetley as you are at home., Starting 12/11/2015, Until Discontinued, Print    !! predniSONE (DELTASONE) 20 MG tablet Take 20 mg by mouth daily with breakfast., Until Discontinued, Historical Med    promethazine (PHENERGAN) 25 MG tablet Take 25 mg by mouth every 4 (four) hours as needed for nausea or vomiting., Until Discontinued, Historical Med     !! - Potential duplicate medications found. Please discuss with provider.       DISCHARGE  INSTRUCTIONS:    No follow up  If you experience worsening of your admission symptoms, develop shortness of breath, life threatening emergency, suicidal or homicidal thoughts you must seek medical attention immediately by calling 911 or calling your MD immediately  if symptoms less severe.  You Must read complete instructions/literature along with all the possible adverse reactions/side effects for all the Medicines you take and that have been prescribed to you. Take any new Medicines after you have completely understood and accept all the possible adverse reactions/side effects.   Please note  You were cared for by a hospitalist during your hospital stay. If you have any questions about your discharge medications or the care you received while you were in the hospital after you are discharged, you can call the unit and asked to speak with the hospitalist on call if the hospitalist that took care of you is not available. Once you are discharged, your primary care physician will handle any further medical issues. Please note that NO REFILLS for any discharge medications will be authorized once you are discharged, as it is imperative that you return to your primary care physician (or establish a relationship with a primary care physician if you do not have one) for  your aftercare needs so that they can reassess your need for medications and monitor your lab values.    Today   CHIEF COMPLAINT:   Chief Complaint  Jacqueline Zamora presents with  . Shortness of Breath    HISTORY OF PRESENT ILLNESS:  Jacqueline Zamora  is a 82 y.o. female with a known history of right lung small cell lung cancer, COPD, tobacco abuse, polycythemia who was brought to emergency room for respiratory distress, shortness of breath and hypoxia in the facility. She was recently discharged from the hospital for community-acquired pneumonia and COPD exacerbation on levofloxacin. She was noted to be short of breath and hypoxic in facility.  On EMS arrival, she was placed on CPAP, initial O2 saturations were in 70s. Jacqueline Zamora had no fevers, vomiting, diarrhea or chest pains, per emergency room physician's note. Jacqueline Zamora herself was not able to provide history since she was obtunded. She was seen on BiPAP in ER,  denied any pain or discomfort. Her family make decision about comfort care measures only. Jacqueline Zamora was admitted to the hospital for comfort care , since hospice home bed was not available and expired shortly after. Discussion by problem: #1. Acute on chronic respiratory failure with hypoxia and hypercapnia, Jacqueline Zamora was initiated comfort care per family request,  supportive care was provided and she died shortly after admission. #2. right sided pneumonia, Jacqueline Zamora's family decided against therapy due to overall unfavorable outcomes, supportive therapy was provided, palliative care consulted, Jacqueline Zamora was administered Morphine, Oxygen for comfort.  #3. Acute on chronic renal failure, Jacqueline Zamora was given IV fluids while in the emergency room, no further labs were drawn due to comfort care measures #4. Leukocytosis, due to sepsis, no labs were checked due to comfort care measures   VITAL SIGNS:  Blood pressure 115/62, pulse 117, temperature 97.8 F (36.6 C), temperature source Oral, resp. rate 16, height '5\' 3"'$  (1.6 m), weight 63.866 kg (140 lb 12.8 oz), SpO2 88 %.  I/O:  No intake or output data in the 24 hours ending 12/19/15 1815  PHYSICAL EXAMINATION:  GENERAL:  82 y.o.-year-old Jacqueline Zamora lying in the bed with no acute distress.  EYES: Pupils equal, round, reactive to light and accommodation. No scleral icterus. Extraocular muscles intact.  HEENT: Head atraumatic, normocephalic. Oropharynx and nasopharynx clear.  NECK:  Supple, no jugular venous distention. No thyroid enlargement, no tenderness.  LUNGS: Normal breath sounds bilaterally, no wheezing, rales,rhonchi or crepitation. No use of accessory muscles of respiration.   CARDIOVASCULAR: S1, S2 normal. No murmurs, rubs, or gallops.  ABDOMEN: Soft, non-tender, non-distended. Bowel sounds present. No organomegaly or mass.  EXTREMITIES: No pedal edema, cyanosis, or clubbing.  NEUROLOGIC: Cranial nerves II through XII are intact. Muscle strength 5/5 in all extremities. Sensation intact. Gait not checked.  PSYCHIATRIC: The Jacqueline Zamora is alert and oriented x 3.  SKIN: No obvious rash, lesion, or ulcer.   DATA REVIEW:   CBC  Recent Labs Lab 12-28-15 0745  WBC 33.5*  HGB 15.2  HCT 45.8  PLT 176    Chemistries   Recent Labs Lab 12/16/15 1408 12/28/2015 0745  NA 135 134*  K 4.4 4.8  CL 95* 93*  CO2 31 32  GLUCOSE 173* 167*  BUN 62* 69*  CREATININE 1.50* 1.80*  CALCIUM 8.7* 8.7*  MG 1.7  --   AST 18 19  ALT 14 14  ALKPHOS 83 92  BILITOT 0.7 0.8    Cardiac Enzymes  Recent Labs Lab December 28, 2015 0745  TROPONINI 0.12*  Microbiology Results  Results for orders placed or performed during the hospital encounter of 12/31/2015  Blood culture (routine x 2)     Status: None (Preliminary result)   Collection Time: Dec 31, 2015  7:46 AM  Result Value Ref Range Status   Specimen Description BLOOD LEFT ARM  Final   Special Requests BOTTLES DRAWN AEROBIC AND ANAEROBIC [place]  Final   Culture NO GROWTH 2 DAYS  Final   Report Status PENDING  Incomplete  Blood culture (routine x 2)     Status: None (Preliminary result)   Collection Time: 31-Dec-2015  8:00 AM  Result Value Ref Range Status   Specimen Description BLOOD RIGHT AC  Final   Special Requests   Final    BOTTLES DRAWN AEROBIC AND ANAEROBIC AER 3ML ANA 1ML   Culture NO GROWTH 2 DAYS  Final   Report Status PENDING  Incomplete    RADIOLOGY:  No results found.  EKG:   Orders placed or performed during the hospital encounter of 31-Dec-2015  . ED EKG  . ED EKG      Management plans discussed with the Jacqueline Zamora, family and they are in agreement.  CODE STATUS:  Code Status History    Date Active Date  Inactive Code Status Order ID Comments User Context   12/31/15  5:36 PM 12/18/2015  2:06 AM DNR [id number]  Colleen Can, MD Inpatient   Dec 31, 2015 12:29 PM 2015/12/31  5:36 PM DNR [id number]  Theodoro Grist, MD ED   12-31-2015  9:39 AM 2015/12/31 12:29 PM DNR [id number]  Vilinda Boehringer, MD ED   12/05/2015  2:14 AM 12/11/2015  7:28 PM Full Code [id number]  Lance Coon, MD Inpatient    Questions for Most Recent Historical Code Status (Order [id number])    Question Answer Comment   In the event of cardiac or respiratory ARREST Do not call a "code blue"    In the event of cardiac or respiratory ARREST Do not perform Intubation, CPR, defibrillation or ACLS    In the event of cardiac or respiratory ARREST Use medication by any route, position, wound care, and other measures to relive pain and suffering. May use oxygen, suction and manual treatment of airway obstruction as needed for comfort.    Comments DNR and DNI and no HI FLOW and NO BIPAP. Comfort care only.       TOTAL TIME TAKING CARE OF THIS Jacqueline Zamora: 35  minutes.    Theodoro Grist M.D on 12/19/2015 at 6:15 PM  Between 7am to 6pm - Pager - [phone]  After 6pm go to [email or web address] - password [email or web address] [facility]  [place] [facility]  Office  [phone]  CC: Primary care physician; Adin Hector, MD

## 2015-12-29 NOTE — Care Management (Addendum)
Received call from patient's daughter Butch Penny stating that they have discussed hospice agencies and would like hospice of [place] - home. CSW referral should be placed after Santiago Glad with [facility] hospice evaluates patient for hospice home. Santiago Glad should have a bed at hospice tomorrow. Apparently [facility]  had received hospice referral on  from Dr. Jeb Levering today.

## 2015-12-29 NOTE — Care Management Note (Signed)
Case Management Note  Patient Details  Name: Jacqueline Zamora MRN: [id number] Date of Birth: 1933-04-05  Subjective/Objective:    [place], [facility] , to brief her about the patient. She states there are no Hospice Home beds available at this time, but I have given her the patient info.        Dr Ether Griffins is to admit to comfort care in hospital.        Action/Plan:   Expected Discharge Date:                  Expected Discharge Plan:     In-House Referral:     Discharge planning Services     Post Acute Care Choice:    Choice offered to:     DME Arranged:    DME Agency:     HH Arranged:    [place] Agency:     Status of Service:     Medicare Important Message Given:    Date Medicare IM Given:    Medicare IM give by:    Date Additional Medicare IM Given:    Additional Medicare Important Message give by:     If discussed at [place] of Stay Meetings, dates discussed:    Additional Comments:  Beau Fanny, RN 2016-01-02, 10:26 AM

## 2015-12-29 NOTE — H&P (Signed)
[facility] NAME: Jacqueline Zamora    MR#:  [id number]  DATE OF BIRTH:  [birth year]  DATE OF ADMISSION:  01/05/16  PRIMARY CARE PHYSICIAN: Tama High III, MD   REQUESTING/REFERRING PHYSICIAN:   CHIEF COMPLAINT:   Chief Complaint  Patient presents with  . Shortness of Breath    HISTORY OF PRESENT ILLNESS: Jacqueline Zamora  is a 82 y.o. female with a known history of recurrent right lung small cell lung cancer, COPD, tobacco abuse, polycythemia who was brought to emergency room for respiratory distress, shortness of breath and hypoxia in the facility. She was recently discharged from the hospital for community-acquired pneumonia and COPD exacerbation on levofloxacin. She was noted to be short of breath and hypoxic in facility. On EMS arrival, she was placed on CPAP, initial O2 saturations were in 70s. Patient had no fevers, vomiting, diarrhea or chest pains, per emergency room physician's note. Patient herself is not able to provide history since she is obtunded. She is on BiPAP at present, but denies any pain or discomfort. His family make decision about comfort care measures only. Patient is being admitted to the hospital for comfort care measures, since hospice home but is not available at present  PAST MEDICAL HISTORY:   Past Medical History  Diagnosis Date  . COPD (chronic obstructive pulmonary disease) ([place])   . Hypercalcemia   . Polycythemia   . Hyperlipidemia   . Vitamin D deficiency   . Vitamin B12 deficiency   . Pulmonary nodule   . Cancer ([place])     Small Cell Lung Cancer (RT)  . Pneumonia     PAST SURGICAL HISTORY: Past Surgical History  Procedure Laterality Date  . Tonsillectomy    . Colon surgery      Sigmoid Colectomy  . Cataract extraction    . Eye surgery      Cataract Extraction 2008  . Colonoscopy with propofol N/A 05/02/2015    Procedure: COLONOSCOPY WITH PROPOFOL;  Surgeon: Hulen Luster, MD;  Location: [facility]  ENDOSCOPY;  Service: Gastroenterology;  Laterality: N/A;  . Esophagogastroduodenoscopy (egd) with propofol N/A 05/02/2015    Procedure: ESOPHAGOGASTRODUODENOSCOPY (EGD) WITH PROPOFOL;  Surgeon: Hulen Luster, MD;  Location: [facility] ENDOSCOPY;  Service: Gastroenterology;  Laterality: N/A;    SOCIAL HISTORY:  Social History  Substance Use Topics  . Smoking status: Current Every Day Smoker -- 1.00 packs/day for 62 years    Types: Cigarettes  . Smokeless tobacco: Never Used  . Alcohol Use: No    FAMILY HISTORY:  Family History  Problem Relation Age of Onset  . Cancer Brother   . Breast cancer Daughter   . Thyroid cancer Daughter     DRUG ALLERGIES:  Allergies  Allergen Reactions  . Iodine Rash  . Latex Rash  . Neomycin-Bacitracin Zn-Polymyx Rash  . Penicillins Rash and Other (See Comments)    Family cannot answer follow-up questions    Review of Systems  Unable to perform ROS: acuity of condition    MEDICATIONS AT HOME:  Prior to Admission medications   Medication Sig Start Date End Date Taking? Authorizing Provider  albuterol (PROVENTIL HFA;VENTOLIN HFA) 108 (90 Base) MCG/ACT inhaler Inhale 2 puffs into the lungs every 6 (six) hours as needed for wheezing or shortness of breath.    Historical Provider, MD  ALPRAZolam Duanne Moron) 0.25 MG tablet Take 1 tablet (0.25 mg total) by mouth 2 (two) times daily as needed for anxiety.  09/15/15   Forest Gleason, MD  azelastine (ASTELIN) 0.1 % nasal spray Place 1 spray into both nostrils 2 (two) times daily as needed for rhinitis or allergies. Use in each nostril as directed    Historical Provider, MD  cholecalciferol (VITAMIN D) 1000 units tablet Take 1,000 Units by mouth daily.    Historical Provider, MD  cyanocobalamin 500 MCG tablet Take 500 mcg by mouth daily.    Historical Provider, MD  feeding supplement, ENSURE ENLIVE, (ENSURE ENLIVE) LIQD Take 237 mLs by mouth 2 (two) times daily between meals. 12/11/15   Bettey Costa, MD  fluticasone (FLONASE) 50  MCG/ACT nasal spray Place 2 sprays into the nose daily as needed for allergies or rhinitis.  01/15/15 01/15/16  Historical Provider, MD  Fluticasone-Salmeterol (ADVAIR DISKUS) 250-50 MCG/DOSE AEPB Inhale 1 puff into the lungs 2 (two) times daily.    Historical Provider, MD  HYDROcodone-acetaminophen (NORCO/VICODIN) 5-325 MG tablet Take 0.5-1 tablets by mouth every 6 (six) hours as needed for moderate pain.    Historical Provider, MD  ipratropium-albuterol (DUONEB) 0.5-2.5 (3) MG/3ML SOLN Take 3 mLs by nebulization 3 (three) times daily. 12/16/15   Forest Gleason, MD  metoprolol tartrate (LOPRESSOR) 25 MG tablet Take 1 tablet (25 mg total) by mouth 2 (two) times daily. 12/11/15   Bettey Costa, MD  nicotine (NICODERM CQ - DOSED IN MG/24 HOURS) 14 mg/24hr patch Place 1 patch (14 mg total) onto the skin daily. 12/11/15   Bettey Costa, MD  omeprazole (PRILOSEC) 20 MG capsule Take 1 capsule (20 mg total) by mouth 2 (two) times daily before a meal. 08/18/15   Forest Gleason, MD  predniSONE (DELTASONE) 10 MG tablet Take 1 tablet (10 mg total) by mouth daily with breakfast. Start at 50 mg daily for 2 days taper by 10 mg every 2 days when at 10 mg daily take this indefinetley as you are at home. 12/11/15   Bettey Costa, MD  predniSONE (DELTASONE) 5 MG tablet Take 2 tablets (10 mg total) by mouth daily with breakfast. Patient will take prednisone taper then resume daily 10 mg dose 12/11/15   Sital Mody, MD      PHYSICAL EXAMINATION:   VITAL SIGNS: Blood pressure 120/63, pulse 111, temperature 100 F (37.8 C), temperature source Rectal, resp. rate 7, height '5\' 3"'$  (1.6 m), weight 49.896 kg (110 lb), SpO2 97 %.  GENERAL:  82 y.o.-year-old patient lying in the bed  in moderate to severe respiratorydistress, on BiPAP at present, tachypneic, somnolent .  EYES: Pupils equal, round, reactive to light and accommodation. No scleral icterus. Extraocular muscles intact.  HEENT: Head atraumatic, normocephalic. Oropharynx and nasopharynx  clear.  NECK:  Supple, no jugular venous distention. No thyroid enlargement, no tenderness.  LUNGS:  Mildly diminished breath sounds  on the right, dull to percussion,  no wheezing, left-sided rales,rhonchi  And crepitations noted on auscultation.  Using accessory muscles of respiration, tachypneic.  CARDIOVASCULAR: S1, S2 , tachycardic, regular No murmurs, rubs, or gallops.  ABDOMEN: Soft, nontender, nondistended. Bowel sounds present. No organomegaly or mass.  EXTREMITIES: No pedal edema, cyanosis, or clubbing.  NEUROLOGIC: Cranial nerves II through XII are intact. Muscle strength 5/5 in all extremities. Sensation intact. Gait not checked.  PSYCHIATRIC: The patient is  Somnolent, but awaken with verbal stimuli, able to shake her head to questions, intermittently able to follow commands.  SKIN: No obvious rash, lesion, or ulcer.   LABORATORY PANEL:   CBC  Recent Labs Lab 12/11/15 0503 12/16/15 1408 19-Dec-2015  0745  WBC 6.0 29.8* 33.5*  HGB 15.5 15.6 15.2  HCT 46.3 47.0 45.8  PLT 188 175 176  MCV 89.9 90.8 91.9  MCH 30.1 30.1 30.5  MCHC 33.4 33.1 33.2  RDW 14.3 14.2 14.1  LYMPHSABS  --  0.3* 0.3*  MONOABS  --  0.6 1.0*  EOSABS  --  0.0 0.0  BASOSABS  --  0.1 0.5*   ------------------------------------------------------------------------------------------------------------------  Chemistries   Recent Labs Lab 12/16/15 1408 2015-12-24 0745  NA 135 134*  K 4.4 4.8  CL 95* 93*  CO2 31 32  GLUCOSE 173* 167*  BUN 62* 69*  CREATININE 1.50* 1.80*  CALCIUM 8.7* 8.7*  MG 1.7  --   AST 18 19  ALT 14 14  ALKPHOS 83 92  BILITOT 0.7 0.8   ------------------------------------------------------------------------------------------------------------------  Cardiac Enzymes  Recent Labs Lab 12-24-2015 0745  TROPONINI 0.12*   ------------------------------------------------------------------------------------------------------------------  RADIOLOGY: Dg Chest Portable 1  View  2015/12/24  CLINICAL DATA:  Increased shortness of breath, lung cancer. EXAM: PORTABLE CHEST 1 VIEW COMPARISON:  12/10/2015 and PET 09/12/2015. FINDINGS: Trachea is midline. Heart size normal. Thoracic aorta is calcified. Right IJ Port-A-Cath terminates in the region of the low SVC. Consolidation is seen in the right perihilar region with minimal streaky opacification at the right lung base, new. There may be a tiny right pleural effusion. Biapical pleural thickening. Lungs are hyperinflated. IMPRESSION: 1. Right perihilar consolidation with new streaky opacification at the right lung base, possibly due to atypical/viral pneumonia or infectious bronchiolitis. Postobstructive pneumonitis cannot be excluded. 2. There may be a tiny right pleural effusion. Electronically Signed   By: Lorin Picket M.D.   On: December 24, 2015 08:04    EKG: Orders placed or performed during the hospital encounter of 12-24-2015  . ED EKG  . ED EKG   EKG revealed a sinus tachycardia at rate of 129, multiple premature, [place] ventricle and supraventricular, left atrial enlargement, possible biatrial enlargement, probable lateral infarct, age undetermined, nonspecific ST-T changes IMPRESSION AND PLAN:  Active Problems:   Respiratory failure with hypoxia and hypercapnia ([place]) #1. Acute on chronic respiratory failure with hypoxia and hypercapnia, patient will be under comfort care per family request, hospice is consulted, possible discharge to hospice home if bed is available tomorrow, supportive therapy  #2. Suspected right sided pneumonia, patient's family decided against  therapy due to overall unfavorable outcomes, supportive therapy.  #3. Acute on chronic renal failure, patient was given IV fluids while in the emergency room, no further labs will be drawn due to comfort care measures #4. Leukocytosis, likely due to sepsis, no labs will be checked due to comfort care measures    All the records are reviewed and case  discussed with ED provider. Management plans discussed with the patient, family and they are in agreement.  CODE STATUS:    Code Status Orders        Start     Ordered   2015/12/24 0940  Do not attempt resuscitation (DNR)   Continuous    Question Answer Comment  In the event of cardiac or respiratory ARREST Do not call a "code blue"   In the event of cardiac or respiratory ARREST Do not perform Intubation, CPR, defibrillation or ACLS   In the event of cardiac or respiratory ARREST Use medication by any route, position, wound care, and other measures to relive pain and suffering. May use oxygen, suction and manual treatment of airway obstruction as needed for comfort.  12/30/15 7092    Code Status History    Date Active Date Inactive Code Status Order ID Comments User Context   12/05/2015  2:14 AM 12/11/2015  7:28 PM Full Code [id number]  Lance Coon, MD Inpatient       TOTAL TIME TAKING CARE OF THIS PATIENT: 50  minutes.  Discussed this patient's 2 daughters, all questions were answered, and they voiced understanding  Aaliayah Miao M.D on 2015/12/30 at 10:40 AM  Between 7am to 6pm - Pager - [phone] After 6pm go to [email or web address] - password [email or web address] [facility]  [facility]  Office  [phone]  CC: Primary care physician; Adin Hector, MD

## 2015-12-29 NOTE — Progress Notes (Addendum)
LCSW aware of patient and consult. Patient is from SNF where she was recently placed in admission 12/04/15-12/11/15 to [facility]. Contact made with Doug (at [facility])  who is aware of patient's medical condition and admission.  Please see full assessment completed by CSW on 12/05/15 for additional information.   Plan:  Palliative Care has been consulted. Will give Hospice information once Palliative has followed up with family and assist with any other resources. If patient stable to DC back to facility, she will return to [facility] and LCSW will stay in touch with facility.    Will continue to follow acutely and assist with FL2.    Lane Hacker, MSW Clinical Social Work: System [employer] [phone]

## 2015-12-29 NOTE — Care Management Note (Signed)
Case Management Note  Patient Details  Name: Jacqueline Zamora MRN: [id number] Date of Birth: [birth year]  Subjective/Objective:                  I have met with patient and both daughters to present hospice agency list. Patient presents from [facility] after recent hospital discharge related to respiratory failure. Family stated that patient has not been able to participate with PT at [facility]. Patient is a DNR. She is on O2.   Action/Plan: List of hospice agencies left with patient and family to discuss. Patient is undecided. She has thought of hospice as she has been battling lung cancer on and off for some time.   Expected Discharge Date:                  Expected Discharge Plan:     In-House Referral:     Discharge planning Services  CM Consult  Post Acute Care Choice:  Hospice Choice offered to:  Patient, Adult Children  DME Arranged:    DME Agency:     HH Arranged:    [place] Agency:     Status of Service:  In process, will continue to follow  Medicare Important Message Given:    Date Medicare IM Given:    Medicare IM give by:    Date Additional Medicare IM Given:    Additional Medicare Important Message give by:     If discussed at [place] of Stay Meetings, dates discussed:    Additional Comments:  Marshell Garfinkel, RN 2015-12-29, 1:54 PM

## 2015-12-29 NOTE — Consult Note (Signed)
PULMONARY / CRITICAL CARE MEDICINE   Name: Jacqueline Zamora MRN: [id number] DOB: 1933/01/05    ADMISSION DATE:  Jan 11, 2016 CONSULTATION DATE:  01/11/2016  REFERRING MD : Dr. Edd Fabian   CHIEF COMPLAINT:   Shortness of breath, altered mental status   HISTORY OF PRESENT ILLNESS    82 year old female past medical history of small cell lung cancer in right lung, tobacco abuse, status post chemotherapy, status post prophylactic brain radiation, lung cancer recurrence, seen in consultation for respiratory distress. History per 2 daughters at bedside. Patient has been having aggressive lethargy, altered mental status, shortness of breath over the last 2 weeks, she is from nursing home, [facility]. She saw her oncologist yesterday, who stated that patient will no longer receive chemotherapy due to her debilitated state, and recurrence of cancer.  He was noted to be lethargic and requiring 3 L of oxygen at oncologist office.  Family and oncologist discuss hospice care, which they are currently in agreement to, however hospice has not been able to communicate with family due to acute decline of patient in the last 24 hours. Family states that she is a DO NOT RESUSCITATE, and at this time they are willing to speak with palliative care/hospice, and pursue comfort care measures. At the time of my evaluation patient is currently on BiPAP satting at 94%, lethargic, altered, unable to provide a adequate review of systems, FiO2 40%.   SIGNIFICANT EVENTS     PAST MEDICAL HISTORY    :  Past Medical History  Diagnosis Date  . COPD (chronic obstructive pulmonary disease) ([place])   . Hypercalcemia   . Polycythemia   . Hyperlipidemia   . Vitamin D deficiency   . Vitamin B12 deficiency   . Pulmonary nodule   . Cancer ([place])     Small Cell Lung Cancer ([place])  . Pneumonia    Past Surgical History  Procedure Laterality Date  . Tonsillectomy    . Colon surgery      Sigmoid Colectomy  . Cataract  extraction    . Eye surgery      Cataract Extraction 2008  . Colonoscopy with propofol N/A 05/02/2015    Procedure: COLONOSCOPY WITH PROPOFOL;  Surgeon: Hulen Luster, MD;  Location: [facility] ENDOSCOPY;  Service: Gastroenterology;  Laterality: N/A;  . Esophagogastroduodenoscopy (egd) with propofol N/A 05/02/2015    Procedure: ESOPHAGOGASTRODUODENOSCOPY (EGD) WITH PROPOFOL;  Surgeon: Hulen Luster, MD;  Location: [facility] ENDOSCOPY;  Service: Gastroenterology;  Laterality: N/A;   Prior to Admission medications   Medication Sig Start Date End Date Taking? Authorizing Provider  albuterol (PROVENTIL HFA;VENTOLIN HFA) 108 (90 Base) MCG/ACT inhaler Inhale 2 puffs into the lungs every 6 (six) hours as needed for wheezing or shortness of breath.    Historical Provider, MD  ALPRAZolam Duanne Moron) 0.25 MG tablet Take 1 tablet (0.25 mg total) by mouth 2 (two) times daily as needed for anxiety. 09/15/15   Forest Gleason, MD  azelastine (ASTELIN) 0.1 % nasal spray Place 1 spray into both nostrils 2 (two) times daily as needed for rhinitis or allergies. Use in each nostril as directed    Historical Provider, MD  cholecalciferol (VITAMIN D) 1000 units tablet Take 1,000 Units by mouth daily.    Historical Provider, MD  cyanocobalamin 500 MCG tablet Take 500 mcg by mouth daily.    Historical Provider, MD  feeding supplement, ENSURE ENLIVE, (ENSURE ENLIVE) LIQD Take 237 mLs by mouth 2 (two) times daily between meals. 12/11/15   Bettey Costa, MD  fluticasone (FLONASE) 50 MCG/ACT nasal spray Place 2 sprays into the nose daily as needed for allergies or rhinitis.  01/15/15 01/15/16  Historical Provider, MD  Fluticasone-Salmeterol (ADVAIR DISKUS) 250-50 MCG/DOSE AEPB Inhale 1 puff into the lungs 2 (two) times daily.    Historical Provider, MD  HYDROcodone-acetaminophen (NORCO/VICODIN) 5-325 MG tablet Take 0.5-1 tablets by mouth every 6 (six) hours as needed for moderate pain.    Historical Provider, MD  ipratropium-albuterol (DUONEB) 0.5-2.5 (3)  MG/3ML SOLN Take 3 mLs by nebulization 3 (three) times daily. 12/16/15   Forest Gleason, MD  metoprolol tartrate (LOPRESSOR) 25 MG tablet Take 1 tablet (25 mg total) by mouth 2 (two) times daily. 12/11/15   Bettey Costa, MD  nicotine (NICODERM CQ - DOSED IN MG/24 HOURS) 14 mg/24hr patch Place 1 patch (14 mg total) onto the skin daily. 12/11/15   Bettey Costa, MD  omeprazole (PRILOSEC) 20 MG capsule Take 1 capsule (20 mg total) by mouth 2 (two) times daily before a meal. 08/18/15   Forest Gleason, MD  predniSONE (DELTASONE) 10 MG tablet Take 1 tablet (10 mg total) by mouth daily with breakfast. Start at 50 mg daily for 2 days taper by 10 mg every 2 days when at 10 mg daily take this indefinetley as you are at home. 12/11/15   Bettey Costa, MD  predniSONE (DELTASONE) 5 MG tablet Take 2 tablets (10 mg total) by mouth daily with breakfast. Patient will take prednisone taper then resume daily 10 mg dose 12/11/15   Bettey Costa, MD   Allergies  Allergen Reactions  . Iodine Rash  . Latex Rash  . Neomycin-Bacitracin Zn-Polymyx Rash  . Penicillins Rash and Other (See Comments)    Family cannot answer follow-up questions     FAMILY HISTORY   Family History  Problem Relation Age of Onset  . Cancer Brother   . Breast cancer Daughter   . Thyroid cancer Daughter       SOCIAL HISTORY    reports that she has been smoking Cigarettes.  She has a 62 pack-year smoking history. She has never used smokeless tobacco. She reports that she does not drink alcohol or use illicit drugs.  Review of Systems  Unable to perform ROS: mental acuity      VITAL SIGNS    Temp:  [97.6 F (36.4 C)-100 F (37.8 C)] 100 F (37.8 C) (04/19 0813) Pulse Rate:  [115-133] 133 (04/19 0813) Resp:  [18-23] 23 (04/19 0804) BP: (99-120)/(63-80) 120/63 mmHg (04/19 0813) SpO2:  [91 %-97 %] 91 % (04/19 0813) Weight:  [110 lb (49.896 kg)-113 lb (51.256 kg)] 110 lb (49.896 kg) (04/19 0813) HEMODYNAMICS:   VENTILATOR SETTINGS:   INTAKE  / OUTPUT: No intake or output data in the 24 hours ending 12/27/15 0925     PHYSICAL EXAM   Physical Exam  Constitutional:  Cachectic, malnourished, female, in mild respiratory distress  HENT:  Head: Normocephalic and atraumatic.  Eyes: Conjunctivae and EOM are normal. Pupils are equal, round, and reactive to light.  Neck: Normal range of motion. Neck supple.  Cardiovascular: Normal rate, regular rhythm, normal heart sounds and intact distal pulses.   Pulmonary/Chest: She is in respiratory distress. She has wheezes. She has rales. She exhibits no tenderness.  Diffuse wheezing, decreased breath sounds basilar, mild use of accessory muscles  Abdominal: Soft. Bowel sounds are normal. She exhibits no distension.  Musculoskeletal: She exhibits no edema.  Neurological: She is alert.  Alert, disoriented, will respond to verbal stimuli  Skin: Skin is warm and dry.       LABS   LABS:  CBC  Recent Labs Lab 12/11/15 0503 12/16/15 1408 Jan 08, 2016 0745  WBC 6.0 29.8* 33.5*  HGB 15.5 15.6 15.2  HCT 46.3 47.0 45.8  PLT 188 175 176   Coag's No results for input(s): APTT, INR in the last 168 hours. BMET  Recent Labs Lab 12/11/15 0503 12/16/15 1408 2016-01-08 0745  NA 140 135 134*  K 4.2 4.4 4.8  CL 91* 95* 93*  CO2 41* 31 32  BUN 76* 62* 69*  CREATININE 1.40* 1.50* 1.80*  GLUCOSE 108* 173* 167*   Electrolytes  Recent Labs Lab 12/11/15 0503 12/16/15 1408 01/08/16 0745  CALCIUM 9.2 8.7* 8.7*  MG  --  1.7  --    Sepsis Markers  Recent Labs Lab 2016/01/08 0800  LATICACIDVEN 2.0   ABG  Recent Labs Lab 2016-01-08 0827  PHART 7.30*  PCO2ART 63*  PO2ART 22*   Liver Enzymes  Recent Labs Lab 12/16/15 1408 01/08/2016 0745  AST 18 19  ALT 14 14  ALKPHOS 83 92  BILITOT 0.7 0.8  ALBUMIN 3.3* 2.9*   Cardiac Enzymes  Recent Labs Lab 08-Jan-2016 0745  TROPONINI 0.12*   Glucose No results for input(s): GLUCAP in the last 168 hours.   No results found for  this or any previous visit (from the past 240 hour(s)).   Current facility-administered medications:  .  ipratropium-albuterol (DUONEB) 0.5-2.5 (3) MG/3ML nebulizer solution 3 mL, 3 mL, Nebulization, Once, Joanne Gavel, MD, 3 mL at 01/08/16 0802 .  levofloxacin (LEVAQUIN) IVPB 750 mg, 750 mg, Intravenous, Once, Joanne Gavel, MD, Last Rate: 100 mL/hr at 01/08/16 0803, 750 mg at 01/08/2016 0803  Current outpatient prescriptions:  .  albuterol (PROVENTIL HFA;VENTOLIN HFA) 108 (90 Base) MCG/ACT inhaler, Inhale 2 puffs into the lungs every 6 (six) hours as needed for wheezing or shortness of breath., Disp: , Rfl:  .  ALPRAZolam (XANAX) 0.25 MG tablet, Take 1 tablet (0.25 mg total) by mouth 2 (two) times daily as needed for anxiety., Disp: 60 tablet, Rfl: 1 .  azelastine (ASTELIN) 0.1 % nasal spray, Place 1 spray into both nostrils 2 (two) times daily as needed for rhinitis or allergies. Use in each nostril as directed, Disp: , Rfl:  .  cholecalciferol (VITAMIN D) 1000 units tablet, Take 1,000 Units by mouth daily., Disp: , Rfl:  .  cyanocobalamin 500 MCG tablet, Take 500 mcg by mouth daily., Disp: , Rfl:  .  feeding supplement, ENSURE ENLIVE, (ENSURE ENLIVE) LIQD, Take 237 mLs by mouth 2 (two) times daily between meals., Disp: 237 mL, Rfl: 12 .  fluticasone (FLONASE) 50 MCG/ACT nasal spray, Place 2 sprays into the nose daily as needed for allergies or rhinitis. , Disp: , Rfl:  .  Fluticasone-Salmeterol (ADVAIR DISKUS) 250-50 MCG/DOSE AEPB, Inhale 1 puff into the lungs 2 (two) times daily., Disp: , Rfl:  .  HYDROcodone-acetaminophen (NORCO/VICODIN) 5-325 MG tablet, Take 0.5-1 tablets by mouth every 6 (six) hours as needed for moderate pain., Disp: , Rfl:  .  ipratropium-albuterol (DUONEB) 0.5-2.5 (3) MG/3ML SOLN, Take 3 mLs by nebulization 3 (three) times daily., Disp: 360 mL, Rfl: 3 .  metoprolol tartrate (LOPRESSOR) 25 MG tablet, Take 1 tablet (25 mg total) by mouth 2 (two) times daily., Disp: 60  tablet, Rfl: 0 .  nicotine (NICODERM CQ - DOSED IN MG/24 HOURS) 14 mg/24hr patch, Place 1 patch (14 mg total) onto the skin  daily., Disp: 28 patch, Rfl: 0 .  omeprazole (PRILOSEC) 20 MG capsule, Take 1 capsule (20 mg total) by mouth 2 (two) times daily before a meal., Disp: 60 capsule, Rfl: 3 .  predniSONE (DELTASONE) 10 MG tablet, Take 1 tablet (10 mg total) by mouth daily with breakfast. Start at 50 mg daily for 2 days taper by 10 mg every 2 days when at 10 mg daily take this indefinetley as you are at home., Disp: 20 tablet, Rfl: 0 .  predniSONE (DELTASONE) 5 MG tablet, Take 2 tablets (10 mg total) by mouth daily with breakfast. Patient will take prednisone taper then resume daily 10 mg dose, Disp: 30 tablet, Rfl: 0  Facility-Administered Medications Ordered in Other Encounters:  .  sodium chloride 0.9 % injection 10 mL, 10 mL, Intracatheter, PRN, Forest Gleason, MD, 10 mL at 04/17/15 1339  IMAGING    Dg Chest Portable 1 View  12/19/2015  CLINICAL DATA:  Increased shortness of breath, lung cancer. EXAM: PORTABLE CHEST 1 VIEW COMPARISON:  12/10/2015 and PET 09/12/2015. FINDINGS: Trachea is midline. Heart size normal. Thoracic aorta is calcified. Right IJ Port-A-Cath terminates in the region of the low SVC. Consolidation is seen in the right perihilar region with minimal streaky opacification at the right lung base, new. There may be a tiny right pleural effusion. Biapical pleural thickening. Lungs are hyperinflated. IMPRESSION: 1. Right perihilar consolidation with new streaky opacification at the right lung base, possibly due to atypical/viral pneumonia or infectious bronchiolitis. Postobstructive pneumonitis cannot be excluded. 2. There may be a tiny right pleural effusion. Electronically Signed   By: Lorin Picket M.D.   On: 2015/12/19 08:04      Indwelling Urinary Catheter continued, requirement due to   Reason to continue Indwelling Urinary Catheter for strict Intake/Output monitoring for  hemodynamic instability   Central Line continued, requirement due to   Reason to continue [employer] Monitoring of central venous pressure or other hemodynamic parameters   Ventilator continued, requirement due to, resp failure    Ventilator Sedation RASS 0 to -2   Cultures: BCx2  UC  Sputum  Antibiotics:  Lines:   ASSESSMENT/PLAN   82 year old female past medical history of small cell lung cancer with recurrence, status post chemotherapy, shortness of breath, recurrent pneumonia, in consultation for respiratory distress, and possibly new pneumonia in the right base.  Small cell lung cancer with recurrence, no longer chemotherapy candidate Shortness of breath Respiratory distress Hypoxia Right lower lobe pneumonia Post obstructive pneumonitis Metabolic encephalopathy COPD  Plan: -Spoke with 2 daughters at bedside, they are willing to pursue hospice and possibly comfort care measures at this time. -Review of oncology note showed that she is no longer chemotherapy candidate due to debilitation and cancer recurrence -Discussed CODE STATUS with her daughters at bedside, she is now a DO NOT RESUSCITATE -Family, does not want to pursue any further aggressive measures, stated that they want to respect patient wishes of not prolonging suffering and no further lifesaving or aggressive measures at this time. -Palliative consult, hospice consult, comfort care measures, which I am in agreement with. -No further aggressive measures such as BiPAP, pressors, defibrillation, CPR, high flow nasal cannula  -Family wishes to not prolong any further suffering, and understands that prognosis is poor, and would like to make patient comfortable. -Given the patient is now hospice and/or comfort care, she does not require any further ICU services, recommended admission to general medical floor.   Thank you for consulting [facility], we will signoff at this time.  Please  feel free to contact us with any questions at [phone] (please enter 7-digits).    Pulmonary Care Time devoted to patient care services described in this note is 62mns minutes.   Overall, patient is critically ill, prognosis is guarded. Patient at high risk for cardiac arrest and death. Family at this time wish to pursue comfort care and/or hospice  VVilinda Boehringer MD [facility] Pager -[phone](please enter 7-digits) On Call Pager -[phone](please enter 7-digits)     4May 04, 2017 9:25 AM  Note: This note was prepared with Dragon dictation along with smaller phrase technology. Any transcriptional errors that result from this process are unintentional.

## 2015-12-29 NOTE — Progress Notes (Signed)
Arterial blood gas stick yielded venous sample.  ED physician is aware.

## 2015-12-29 NOTE — Progress Notes (Signed)
Called Dr Manuella Ghazi and notified of pt's O2 sats levels 88-89% at 4 L . RT assessed  pt and suggested chest xray. Dr Manuella Ghazi stated that at his point no need for anything else just to increase O2 to 5 L . Orders follow as order per MD

## 2015-12-29 NOTE — Progress Notes (Signed)
Discussed referral for hospice home with [facility] Marshell Garfinkel, she is aware that there are no beds available. Patient is also radpidly  declining and may not be stable enough for transport. Palliative Medicine physician Dr. Megan Salon is addressing symptom management. Writer to follow up in the morning. CMRN Angela aware. Flo Shanks RN, BSN, [facility] [place], hospital liaison [phone]

## 2015-12-29 NOTE — Progress Notes (Signed)
Request for Hospice home bed received from ED [facility]. Patient presented to [facility] ED via EMS from [facility] in respiratory distress. No hospice home beds available. Patient to be admitted for comfort care at this time. Palliative Care consult pending. Will continue follow and assess for stability for transfer when bed available if family would still wish for hospice home bed. Flo Shanks RN, BSN, [facility] [place], [facility] [phone] c

## 2015-12-29 NOTE — ED Provider Notes (Signed)
[facility] Emergency Department Provider Note  ____________________________________________  Time seen: Approximately 7:47 AM  I have reviewed the triage vital signs and the nursing notes.   HISTORY  Chief Complaint Shortness of Breath    HPI EVAMARIE RAETZ is a 82 y.o. female with history of COPD, hyperlipidemia, history of radiation and chemotherapy for lung cancer but not actively receiving therapy who presents for evaluation for shortness of breath today and hypoxia at her living facility, gradual onset, constant since onset, severe. Patient was discharged from [facility] and 12/11/2015 after treatment for community acquired pneumonia sepsis and COPD exacerbation. She was discharged with Levaquin. Today, she was short of breath and hypoxic at her living facility. On EMS arrival, she was placed on CPAP. Her initial O2 saturation was in the 70s and she was placed on CPAP. She denies any fevers, vomiting, diarrhea, chest pain.   Past Medical History  Diagnosis Date  . COPD (chronic obstructive pulmonary disease) ([place])   . Hypercalcemia   . Polycythemia   . Hyperlipidemia   . Vitamin D deficiency   . Vitamin B12 deficiency   . Pulmonary nodule   . Cancer ([place])     Small Cell Lung Cancer ([place])  . Pneumonia     Patient Active Problem List   Diagnosis Date Noted  . UTI (lower urinary tract infection) 12/05/2015  . Sepsis ([place]) 12/04/2015  . CAP (community acquired pneumonia) 12/04/2015  . Anxiety 12/04/2015  . COPD (chronic obstructive pulmonary disease) ([place]) 12/04/2015  . Chronic obstructive pulmonary disease ([place]) 07/15/2015  . Small cell carcinoma of lung ([place]) 07/15/2015  . CAFL (chronic airflow limitation) ([place]) 03/21/2015  . HLD (hyperlipidemia) 03/21/2015  . Polycythemia 03/21/2015  . SCC of lung (small cell carcinoma) ([place]) 03/21/2015  . B12 deficiency 03/21/2015  . Avitaminosis D 03/21/2015  . Compulsive tobacco user syndrome 02/12/2014  .  Current tobacco use 02/12/2014  . Lung mass 01/23/2014  . Calcium blood increased 10/29/2002    Past Surgical History  Procedure Laterality Date  . Tonsillectomy    . Colon surgery      Sigmoid Colectomy  . Cataract extraction    . Eye surgery      Cataract Extraction 2008  . Colonoscopy with propofol N/A 05/02/2015    Procedure: COLONOSCOPY WITH PROPOFOL;  Surgeon: Hulen Luster, MD;  Location: [facility] ENDOSCOPY;  Service: Gastroenterology;  Laterality: N/A;  . Esophagogastroduodenoscopy (egd) with propofol N/A 05/02/2015    Procedure: ESOPHAGOGASTRODUODENOSCOPY (EGD) WITH PROPOFOL;  Surgeon: Hulen Luster, MD;  Location: [facility] ENDOSCOPY;  Service: Gastroenterology;  Laterality: N/A;    Current Outpatient Rx  Name  Route  Sig  Dispense  Refill  . albuterol (PROVENTIL HFA;VENTOLIN HFA) 108 (90 Base) MCG/ACT inhaler   Inhalation   Inhale 2 puffs into the lungs every 6 (six) hours as needed for wheezing or shortness of breath.         . ALPRAZolam (XANAX) 0.25 MG tablet   Oral   Take 1 tablet (0.25 mg total) by mouth 2 (two) times daily as needed for anxiety.   60 tablet   1   . azelastine (ASTELIN) 0.1 % nasal spray   Each Nare   Place 1 spray into both nostrils 2 (two) times daily as needed for rhinitis or allergies. Use in each nostril as directed         . cholecalciferol (VITAMIN D) 1000 units tablet   Oral   Take 1,000 Units by mouth daily.         Marland Kitchen  cyanocobalamin 500 MCG tablet   Oral   Take 500 mcg by mouth daily.         . feeding supplement, ENSURE ENLIVE, (ENSURE ENLIVE) LIQD   Oral   Take 237 mLs by mouth 2 (two) times daily between meals.   237 mL   12   . fluticasone (FLONASE) 50 MCG/ACT nasal spray   Nasal   Place 2 sprays into the nose daily as needed for allergies or rhinitis.          . Fluticasone-Salmeterol (ADVAIR DISKUS) 250-50 MCG/DOSE AEPB   Inhalation   Inhale 1 puff into the lungs 2 (two) times daily.         Marland Kitchen HYDROcodone-acetaminophen  (NORCO/VICODIN) 5-325 MG tablet   Oral   Take 0.5-1 tablets by mouth every 6 (six) hours as needed for moderate pain.         Marland Kitchen ipratropium-albuterol (DUONEB) 0.5-2.5 (3) MG/3ML SOLN   Nebulization   Take 3 mLs by nebulization 3 (three) times daily.   360 mL   3   . metoprolol tartrate (LOPRESSOR) 25 MG tablet   Oral   Take 1 tablet (25 mg total) by mouth 2 (two) times daily.   60 tablet   0   . nicotine (NICODERM CQ - DOSED IN MG/24 HOURS) 14 mg/24hr patch   Transdermal   Place 1 patch (14 mg total) onto the skin daily.   28 patch   0   . omeprazole (PRILOSEC) 20 MG capsule   Oral   Take 1 capsule (20 mg total) by mouth 2 (two) times daily before a meal.   60 capsule   3   . predniSONE (DELTASONE) 10 MG tablet   Oral   Take 1 tablet (10 mg total) by mouth daily with breakfast. Start at 50 mg daily for 2 days taper by 10 mg every 2 days when at 10 mg daily take this indefinetley as you are at home.   20 tablet   0     Start at 50 mg daily for 2 days taper by 10 mg eve ...   . predniSONE (DELTASONE) 5 MG tablet   Oral   Take 2 tablets (10 mg total) by mouth daily with breakfast. Patient will take prednisone taper then resume daily 10 mg dose   30 tablet   0     Allergies Iodine; Latex; Neomycin-bacitracin zn-polymyx; and Penicillins  Family History  Problem Relation Age of Onset  . Cancer Brother   . Breast cancer Daughter   . Thyroid cancer Daughter     Social History Social History  Substance Use Topics  . Smoking status: Current Every Day Smoker -- 1.00 packs/day for 62 years    Types: Cigarettes  . Smokeless tobacco: Never Used  . Alcohol Use: No    Review of Systems Constitutional: No fever/chills Eyes: No visual changes. ENT: No sore throat. Cardiovascular: Denies chest pain. Respiratory: +shortness of breath. Gastrointestinal: No abdominal pain.  No nausea, no vomiting.  No diarrhea.  No constipation. Genitourinary: Negative for  dysuria. Musculoskeletal: Negative for back pain. Skin: Negative for rash. Neurological: Negative for headaches, focal weakness or numbness.  10-point ROS otherwise negative.  ____________________________________________   PHYSICAL EXAM:  Filed Vitals:   December 27, 2015 0800 2015/12/27 0804 27-Dec-2015 0813  BP: 118/80 118/80 120/63  Pulse:  128 133  Temp:   100 F (37.8 C)  TempSrc:   Rectal  Resp: 20 23   Weight:   110  lb (49.896 kg)  SpO2:  97% 91%     Constitutional: Alert and oriented. In moderate respiratory distress but able to speak in short sentences. Eyes: Conjunctivae are normal. PERRL. EOMI. Head: Atraumatic. Nose: No congestion/rhinnorhea. Mouth/Throat: Mucous membranes are dry.  Oropharynx non-erythematous. Neck: No stridor. Supple without meningismus. Cardiovascular: Normal rate, regular rhythm. Grossly normal heart sounds.  Good peripheral circulation. Respiratory: Tachypnea with increased work of breathing, rhonchorous breath sounds throughout the lung fields with diminished breath sounds on the right. Gastrointestinal: Soft and nontender. No distention. No CVA tenderness. Genitourinary: deferred Musculoskeletal: No lower extremity tenderness nor edema.  No joint effusions. Neurologic:  Normal speech and language. No gross focal neurologic deficits are appreciated. Skin:  Skin is warm, dry and intact. No rash noted. Psychiatric: Mood and affect are normal. Speech and behavior are normal.  ____________________________________________   LABS (all labs ordered are listed, but only abnormal results are displayed)  Labs Reviewed  CBC WITH DIFFERENTIAL/PLATELET - Abnormal; Notable for the following:    WBC 33.5 (*)    Neutro Abs 31.7 (*)    Lymphs Abs 0.3 (*)    Monocytes Absolute 1.0 (*)    Basophils Absolute 0.5 (*)    All other components within normal limits  COMPREHENSIVE METABOLIC PANEL - Abnormal; Notable for the following:    Sodium 134 (*)    Chloride 93  (*)    Glucose, Bld 167 (*)    BUN 69 (*)    Creatinine, Ser 1.80 (*)    Calcium 8.7 (*)    Albumin 2.9 (*)    GFR calc non Af Amer 25 (*)    GFR calc Af Amer 29 (*)    All other components within normal limits  TROPONIN I - Abnormal; Notable for the following:    Troponin I 0.12 (*)    All other components within normal limits  BRAIN NATRIURETIC PEPTIDE - Abnormal; Notable for the following:    B Natriuretic Peptide 645.0 (*)    All other components within normal limits  BLOOD GAS, ARTERIAL - Abnormal; Notable for the following:    pH, Arterial 7.30 (*)    pCO2 arterial 63 (*)    pO2, Arterial 22 (*)    Bicarbonate 31.0 (*)    All other components within normal limits  CULTURE, BLOOD (ROUTINE X 2)  CULTURE, BLOOD (ROUTINE X 2)  LACTIC ACID, PLASMA  LACTIC ACID, PLASMA  URINALYSIS COMPLETEWITH MICROSCOPIC ([facility] ONLY)   ____________________________________________  EKG  ED ECG REPORT I, Joanne Gavel, the attending physician, personally viewed and interpreted this ECG.   Date: 12-19-15  EKG Time: 07:44  Rate: 129  Rhythm: sinus tachycardia  Axis: normal  Intervals:none  ST&T Change: No acute ST elevation. Motion artifact slightly limited interpretation.  ____________________________________________  RADIOLOGY  CXR IMPRESSION: 1. Right perihilar consolidation with new streaky opacification at the right lung base, possibly due to atypical/viral pneumonia or infectious bronchiolitis. Postobstructive pneumonitis cannot be excluded. 2. There may be a tiny right pleural effusion. ____________________________________________   PROCEDURES  Procedure(s) performed: None  Critical Care performed: Yes, see critical care note(s). Total critical care time spent 40 minutes.  ____________________________________________   INITIAL IMPRESSION / ASSESSMENT AND PLAN / ED COURSE  Pertinent labs & imaging results that were available during my care of the patient were  reviewed by me and considered in my medical decision making (see chart for details).  BAHAR SHELDEN is a 82 y.o. female with history of COPD, hyperlipidemia,  history of radiation and chemotherapy for lung cancer but not actively receiving therapy who presents for evaluation for shortness of breath today and hypoxia at her living facility. On arrival to the emergency department, she is tachycardic, tachypneic with increased work of breathing, diminished breath sounds in the right lung fields. She was placed on BiPAP on arrival and her oxygen saturations improved to 98%. She is meeting 3 out of 4 Sirs criteria for tachypnea, tachycardia as well as leukocytosis, white blood cell count is 33,000. Her chest x-ray shows concern for pneumonia which is the most likely cause of her sepsis. We'll give IV fluids, vancomycin, Levaquin, aztreonam for treatment of healthcare associated pneumonia. Continue BiPAP. Anticipate admission.  ----------------------------------------- 9:04 AM on 12-30-2015 -----------------------------------------  Heart rate improved to 108 bpm at this time. O2 saturation 99%. Blood pressure 106/70. Patient appears calm on BiPAP. Arterial blood gas was actually a venous sample and is not a reliable indicator of her oxygenation status. I discussed the case with Dr. Stevenson Clinch of the ICU who recommends hospitalist admission and he will consult. I discussed the case with the hospitalist for admission at this time. ____________________________________________   FINAL CLINICAL IMPRESSION(S) / ED DIAGNOSES  Final diagnoses:  Sepsis due to pneumonia ([place])  HCAP (healthcare-associated pneumonia)  Acute on chronic respiratory failure with hypoxia ([place])      Joanne Gavel, MD 12-30-2015 [phone]

## 2015-12-29 DEATH — deceased

## 2016-01-21 ENCOUNTER — Other Ambulatory Visit: Payer: Medicare Other

## 2016-01-21 ENCOUNTER — Ambulatory Visit: Payer: Medicare Other

## 2016-01-21 ENCOUNTER — Ambulatory Visit: Payer: Medicare Other | Admitting: Oncology

## 2016-02-16 ENCOUNTER — Other Ambulatory Visit: Payer: Self-pay | Admitting: Nurse Practitioner

## 2016-04-16 ENCOUNTER — Ambulatory Visit: Payer: Medicare Other | Admitting: Radiation Oncology

## 2016-04-20 ENCOUNTER — Ambulatory Visit: Payer: Medicare Other

## 2016-04-20 ENCOUNTER — Other Ambulatory Visit: Payer: Medicare Other

## 2016-04-20 ENCOUNTER — Ambulatory Visit: Payer: Medicare Other | Admitting: Oncology

## 2017-03-26 IMAGING — CR DG CHEST 2V
1 series · 2 of 2 positions shown · non-contrast
Comparison: 12/04/2015 .

CLINICAL DATA: Progressive weakness.  Follow-up.

EXAM:
CHEST  2 VIEW

[Series 1: x chest ap · 0.14mm/px · 2 of 2 slices shown]
[im 1/2]
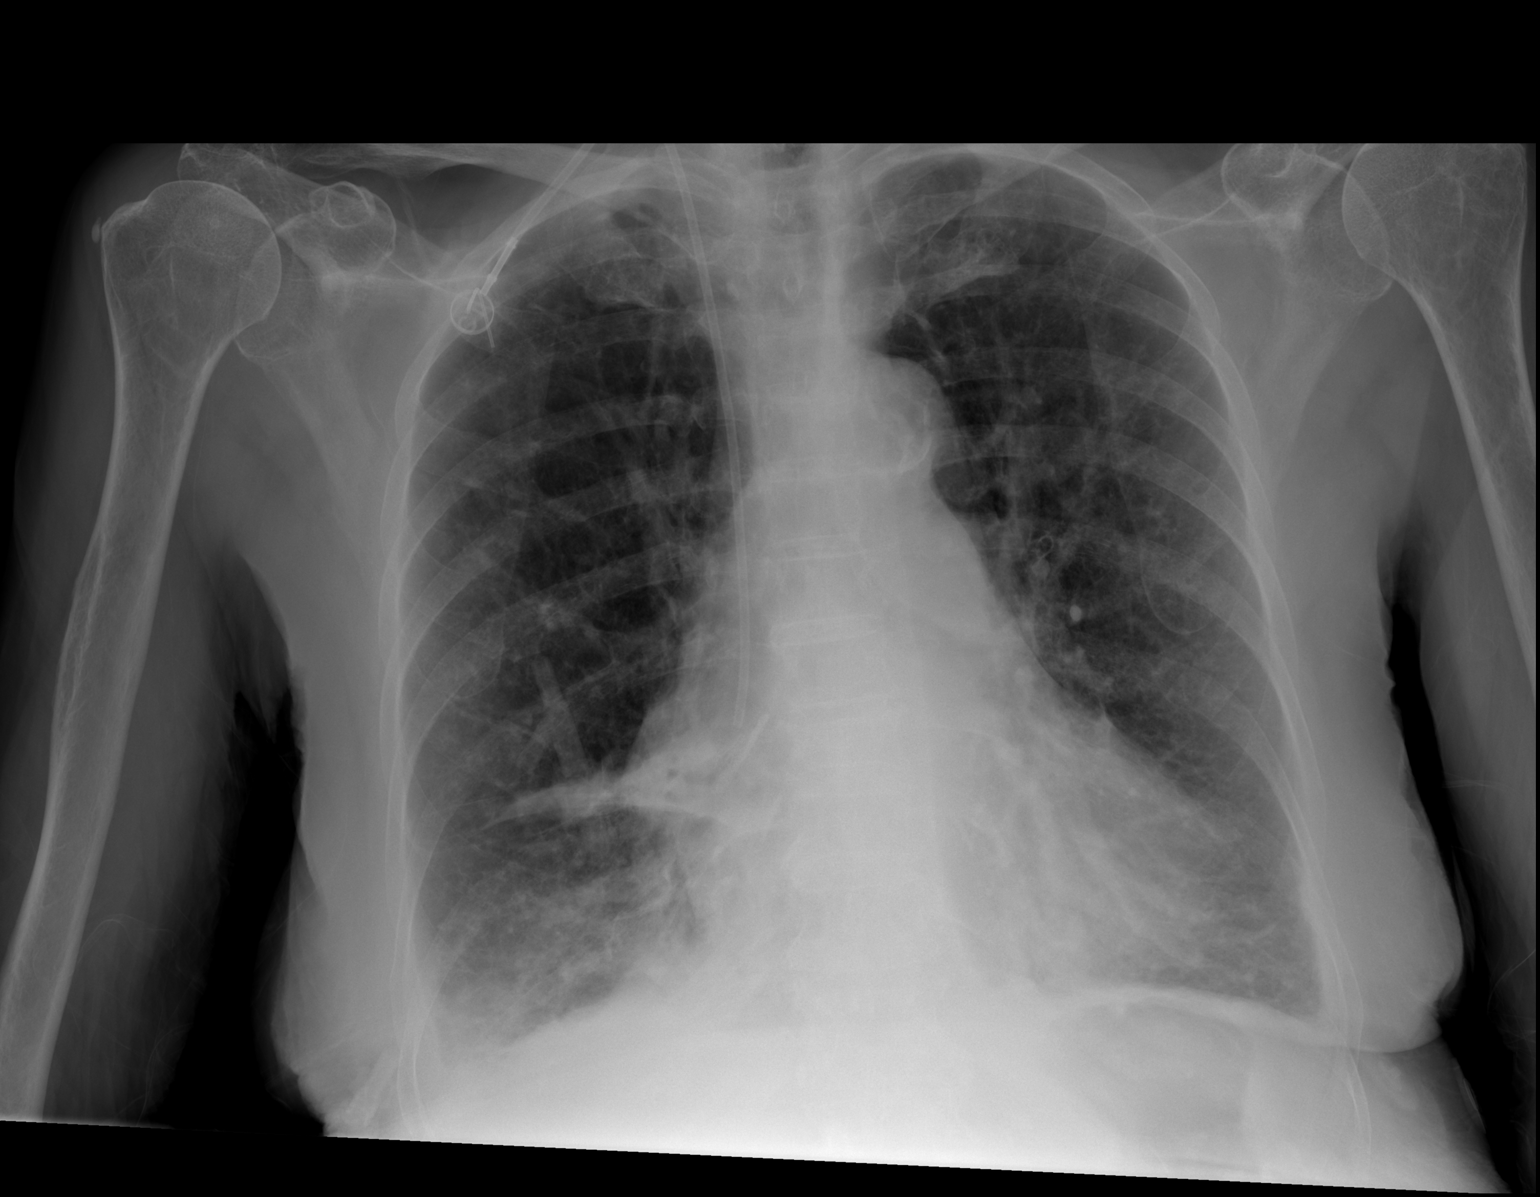
[im 2/2]
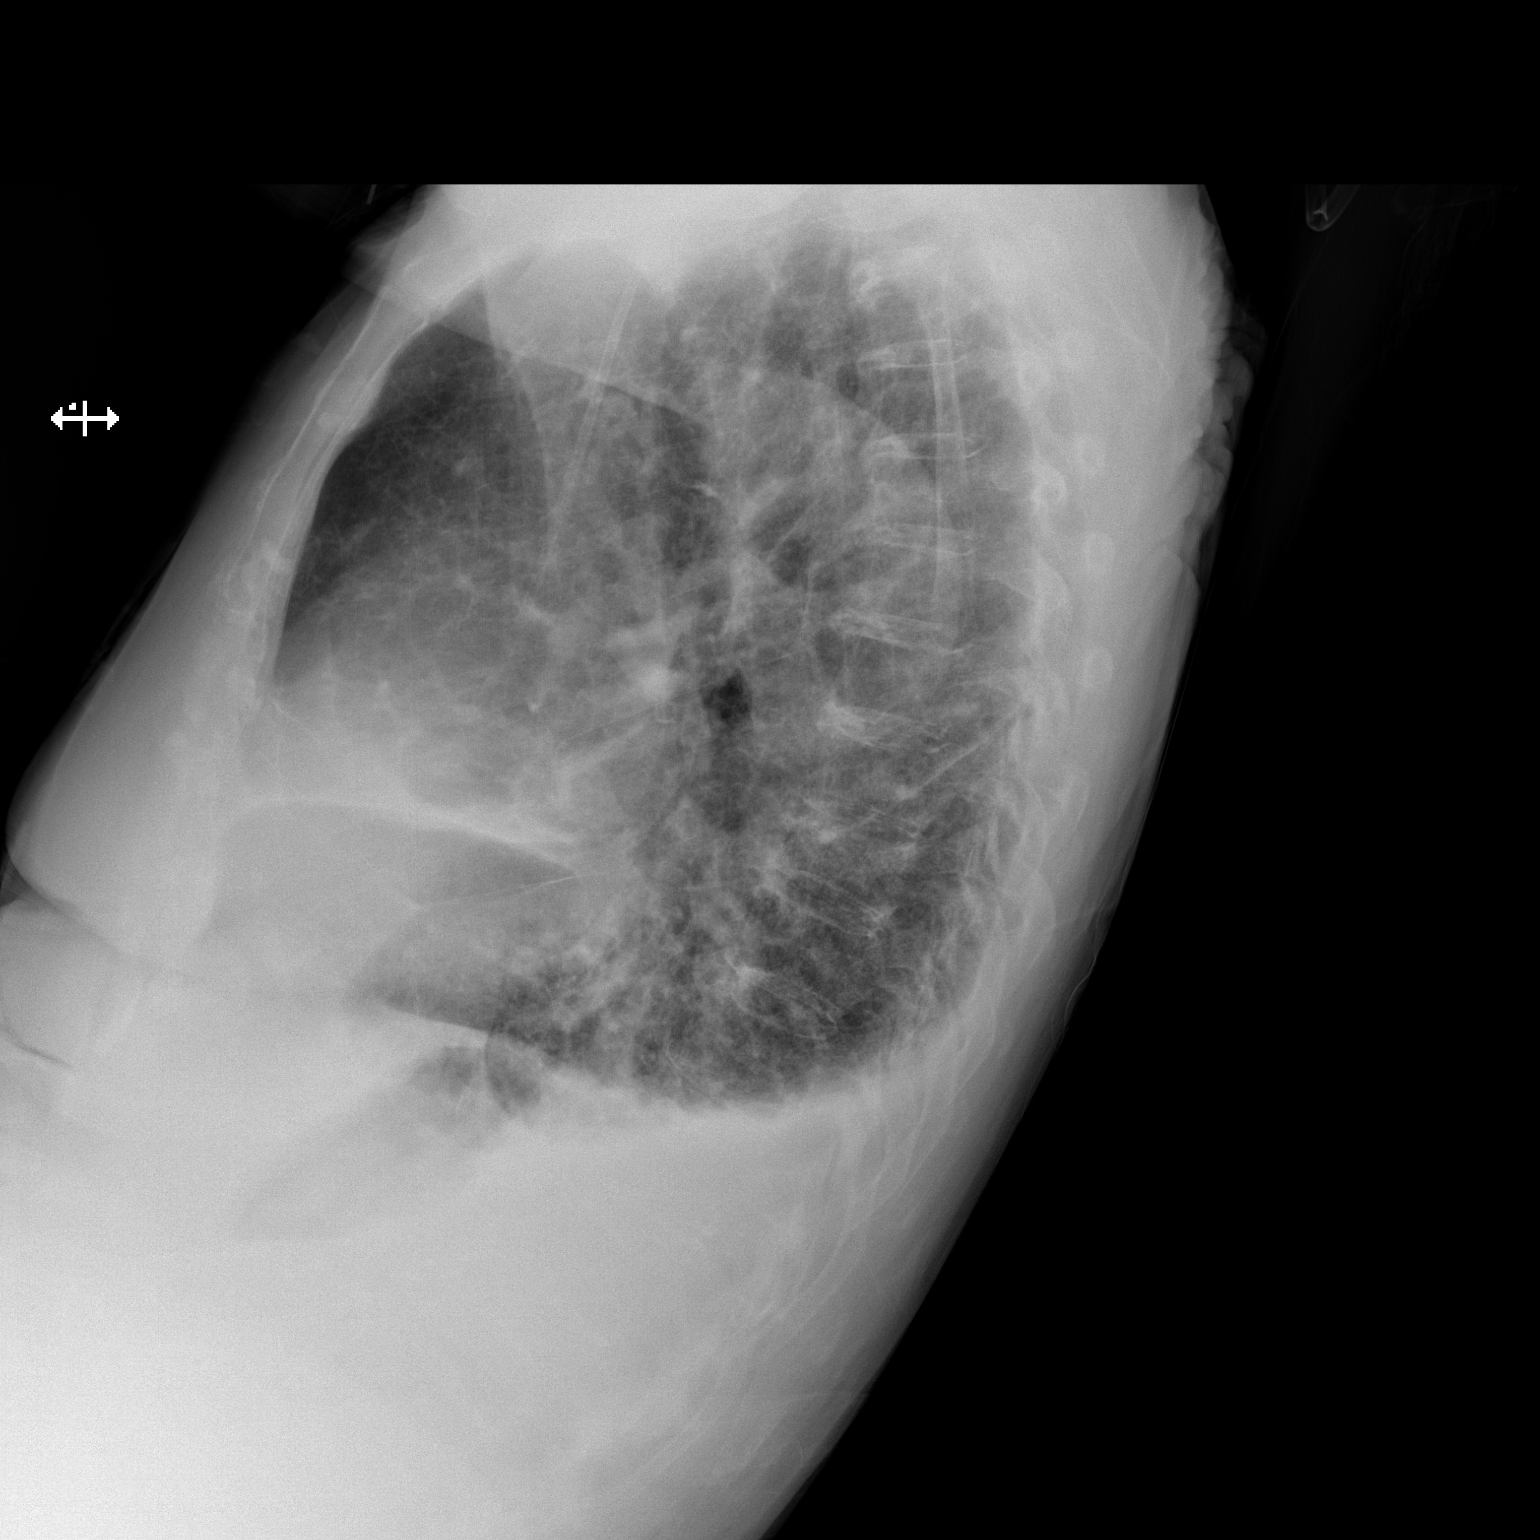

[2 of 2 positions shown; findings below may reference images not displayed]

FINDINGS: Port-A-Cath noted with tip projected over the superior vena cava in
stable position. Mediastinum and hilar structures are stable.
Cardiomegaly with with mild pulmonary vascular prominence and
basilar interstitial prominence with small pleural effusions
suggesting a mild component congestive heart failure. Persistent
right middle lobe atelectasis and/or mass lesion. No pneumothorax.
No acute bony abnormality.
IMPRESSION: 1.  Port-A-Cath in stable position.

2.  Persistent right middle lobe atelectasis and/or mass lesion.

3. Cardiomegaly with pulmonary venous congestion, basilar
interstitial prominence, small pleural effusions suggesting mild
congestive heart failure.

## 2017-03-28 IMAGING — NM NM PULMONARY VENT & PERF
2 series · 16 of 16 positions shown · non-contrast
Comparison: Chest radiograph from today

CLINICAL DATA: History of lung cancer, lung surgery and radiation
therapy. Smoker. Hypoxia.

EXAM:
NUCLEAR MEDICINE VENTILATION - PERFUSION LUNG SCAN
TECHNIQUE: Ventilation images were obtained in multiple projections using
inhaled aerosol Rc-BBm DTPA. Perfusion images were obtained in
multiple projections after intravenous injection of Rc-BBm MAA.
RADIOPHARMACEUTICALS:  31.46 mCi 3echnetium-QQm DTPA aerosol
inhalation and 3.87 mCi 3echnetium-QQm MAA IV

[Series 1000: lung ventilation · 3.90mm/px · 4 acquisitions, 8 frames shown]
[im 1/4]
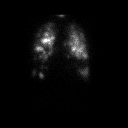
[im 1/4]
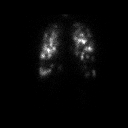
[im 2/4]
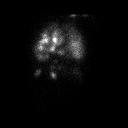
[im 2/4]
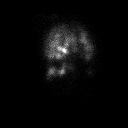
[im 3/4]
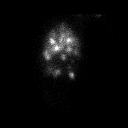
[im 3/4]
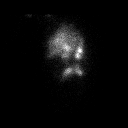
[im 4/4]
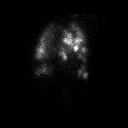
[im 4/4]
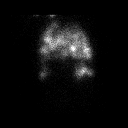

[Series 1000: lung perfusion · 1.95mm/px · 4 acquisitions, 8 frames shown]
[im 1/4]
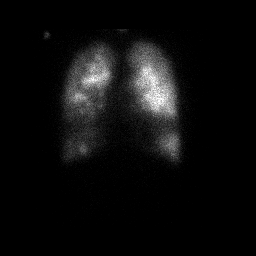
[im 1/4]
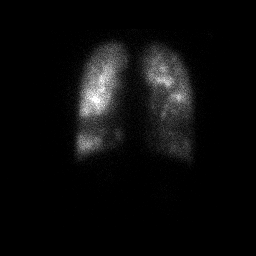
[im 2/4]
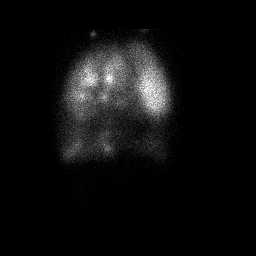
[im 2/4]
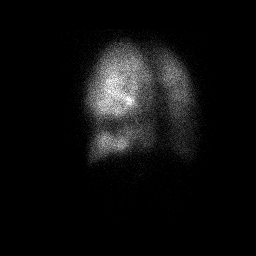
[im 3/4]
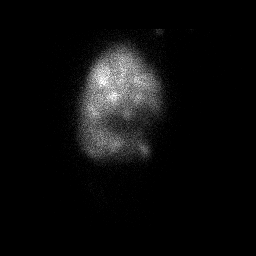
[im 3/4]
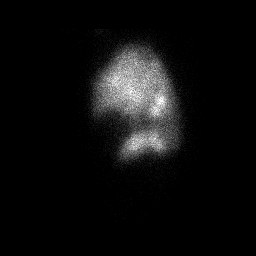
[im 4/4]
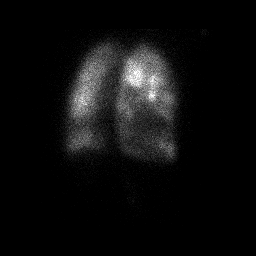
[im 4/4]
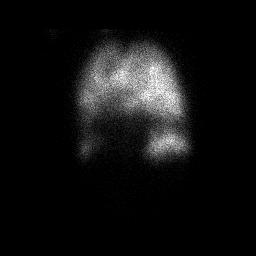

[16 of 16 positions shown; findings below may reference images not displayed]

FINDINGS: Chest x-ray: There are advanced changes of emphysema identified in
both lungs. Postop changes within the right lower lobe are
identified. The lungs appear hyperinflated.

Ventilation: There is heterogeneous radiotracer activity identified
throughout both lungs. Multiple areas of radiotracer clumping within
the large airways is identified compatible with changes of COPD.
Bandlike areas of decreased ventilation across both lower lobes are
identified and are favored to represent sequelae from external beam
radiation. No on FREIBURGHAUS segmental perfusion defects identified.

Perfusion: There is heterogeneous perfusion to both lungs. Bandlike
areas of decreased perfusion within both lower lobes are identified
and correlate with the ventilation abnormalities.
IMPRESSION: 1. There are postsurgical scratch set post therapeutic changes are
identified within both lower lung zones. This diminishes sensitivity
for the assessment of perfusion defects within these affected areas.
No on max segmental perfusion defects identified specific for
pulmonary embolus. However if there is a high clinical suspicion for
pulmonary embolus CTA of the chest would be advised as well as
consideration for lower extremity Doppler.
2. Advanced obstructive pulmonary disease.

## 2017-03-28 IMAGING — DX DG CHEST 1V
1 series · 1 of 1 positions shown · non-contrast
Comparison: PA and lateral chest x-ray December 08, 2015

CLINICAL DATA: Pneumonia, sepsis, small cell lung malignancy,
history of COPD.

EXAM:
CHEST 1 VIEW

[chest ap]
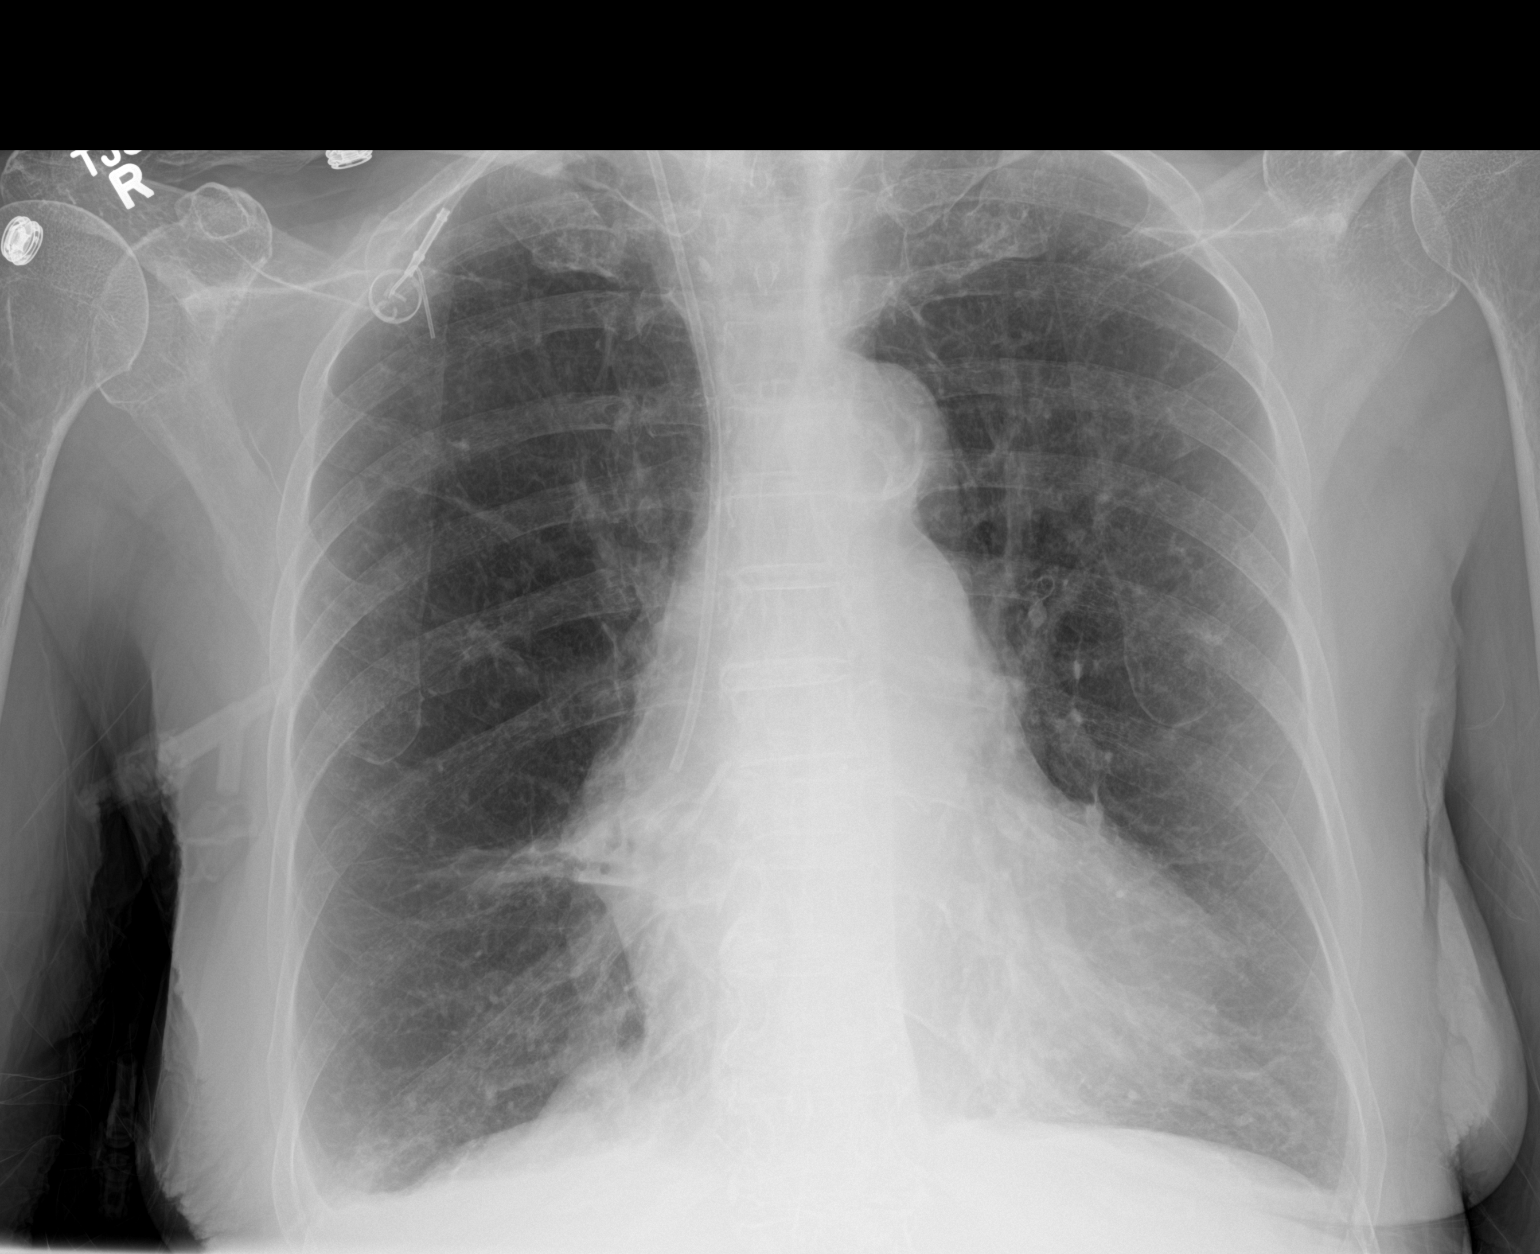

[1 of 1 positions shown; findings below may reference images not displayed]

FINDINGS: The lungs are hyperinflated. There is minimal blunting of the right
lateral costophrenic angle which is stable. There is stable
parenchymal density in the right infrahilar region. There is no
pneumothorax. The heart is top-normal in size. The pulmonary
vascularity is normal. The Port-A-Cath appliance tip projects over
the distal third of the SVC.
IMPRESSION: Slight interval improvement in the pulmonary interstitium since the
previous study. Stable tiny right pleural effusion. Underlying COPD.
Persistent prominence of the right infrahilar soft tissues
consistent with middle lobe atelectasis, pneumonia, or mass.
